# Patient Record
Sex: Male | Born: 1970 | Race: White | Hispanic: Yes | Marital: Married | Smoking: Former smoker
Health system: Southern US, Community
[De-identification: ages and names within clinical notes are randomized; demographics above are authoritative.]

## PROBLEM LIST (undated history)

## (undated) DIAGNOSIS — Z8619 Personal history of other infectious and parasitic diseases: Secondary | ICD-10-CM

## (undated) DIAGNOSIS — N19 Unspecified kidney failure: Secondary | ICD-10-CM

## (undated) DIAGNOSIS — I209 Angina pectoris, unspecified: Secondary | ICD-10-CM

## (undated) DIAGNOSIS — I499 Cardiac arrhythmia, unspecified: Secondary | ICD-10-CM

## (undated) DIAGNOSIS — Z992 Dependence on renal dialysis: Secondary | ICD-10-CM

## (undated) DIAGNOSIS — K219 Gastro-esophageal reflux disease without esophagitis: Secondary | ICD-10-CM

## (undated) DIAGNOSIS — N186 End stage renal disease: Secondary | ICD-10-CM

## (undated) DIAGNOSIS — T4145XA Adverse effect of unspecified anesthetic, initial encounter: Secondary | ICD-10-CM

## (undated) DIAGNOSIS — I10 Essential (primary) hypertension: Secondary | ICD-10-CM

## (undated) DIAGNOSIS — Z9889 Other specified postprocedural states: Secondary | ICD-10-CM

## (undated) DIAGNOSIS — R112 Nausea with vomiting, unspecified: Secondary | ICD-10-CM

## (undated) DIAGNOSIS — T8859XA Other complications of anesthesia, initial encounter: Secondary | ICD-10-CM

## (undated) DIAGNOSIS — Z8639 Personal history of other endocrine, nutritional and metabolic disease: Secondary | ICD-10-CM

## (undated) DIAGNOSIS — N189 Chronic kidney disease, unspecified: Secondary | ICD-10-CM

## (undated) DIAGNOSIS — D649 Anemia, unspecified: Secondary | ICD-10-CM

## (undated) HISTORY — DX: Essential (primary) hypertension: I10

## (undated) HISTORY — DX: Dependence on renal dialysis: Z99.2

## (undated) HISTORY — DX: Chronic kidney disease, unspecified: N18.9

## (undated) HISTORY — DX: Anemia, unspecified: D64.9

## (undated) HISTORY — DX: Personal history of other endocrine, nutritional and metabolic disease: Z86.39

## (undated) HISTORY — DX: Personal history of other infectious and parasitic diseases: Z86.19

## (undated) HISTORY — DX: Unspecified kidney failure: N19

## (undated) HISTORY — DX: End stage renal disease: N18.6

---

## 2005-06-25 ENCOUNTER — Emergency Department (HOSPITAL_COMMUNITY): Admission: EM | Admit: 2005-06-25 | Discharge: 2005-06-25 | Payer: Self-pay | Admitting: Emergency Medicine

## 2005-07-12 ENCOUNTER — Inpatient Hospital Stay (HOSPITAL_COMMUNITY): Admission: EM | Admit: 2005-07-12 | Discharge: 2005-07-23 | Payer: Self-pay | Admitting: Emergency Medicine

## 2005-07-12 ENCOUNTER — Ambulatory Visit: Payer: Self-pay | Admitting: Internal Medicine

## 2005-07-13 ENCOUNTER — Encounter (INDEPENDENT_AMBULATORY_CARE_PROVIDER_SITE_OTHER): Payer: Self-pay | Admitting: *Deleted

## 2005-07-14 ENCOUNTER — Encounter: Payer: Self-pay | Admitting: Vascular Surgery

## 2005-08-12 ENCOUNTER — Ambulatory Visit (HOSPITAL_COMMUNITY): Admission: RE | Admit: 2005-08-12 | Discharge: 2005-08-12 | Payer: Self-pay | Admitting: Vascular Surgery

## 2005-12-11 ENCOUNTER — Ambulatory Visit (HOSPITAL_COMMUNITY): Admission: RE | Admit: 2005-12-11 | Discharge: 2005-12-11 | Payer: Self-pay | Admitting: Nephrology

## 2005-12-18 ENCOUNTER — Encounter: Payer: Self-pay | Admitting: Vascular Surgery

## 2005-12-18 ENCOUNTER — Ambulatory Visit (HOSPITAL_COMMUNITY): Admission: RE | Admit: 2005-12-18 | Discharge: 2005-12-18 | Payer: Self-pay | Admitting: Vascular Surgery

## 2006-01-22 ENCOUNTER — Ambulatory Visit (HOSPITAL_COMMUNITY): Admission: RE | Admit: 2006-01-22 | Discharge: 2006-01-22 | Payer: Self-pay | Admitting: Nephrology

## 2006-09-11 ENCOUNTER — Ambulatory Visit: Payer: Self-pay | Admitting: Vascular Surgery

## 2006-09-12 ENCOUNTER — Ambulatory Visit: Payer: Self-pay | Admitting: Vascular Surgery

## 2006-09-12 ENCOUNTER — Ambulatory Visit (HOSPITAL_COMMUNITY): Admission: RE | Admit: 2006-09-12 | Discharge: 2006-09-12 | Payer: Self-pay | Admitting: Vascular Surgery

## 2006-11-18 IMAGING — CR DG CHEST 1V PORT
1 series · 1 of 1 positions shown · non-contrast
Comparison: none

CLINICAL DATA: Renal failure, catheter placement

Portable chest at 7628:
Comparison to earlier film of the same day. Tunneled right IJ catheter extends
to the proximal right atrium. There is mild kink in one of the lumens near the
vein entry site. No pneumothorax. Lungs clear. Heart size normal.

[view not recorded]
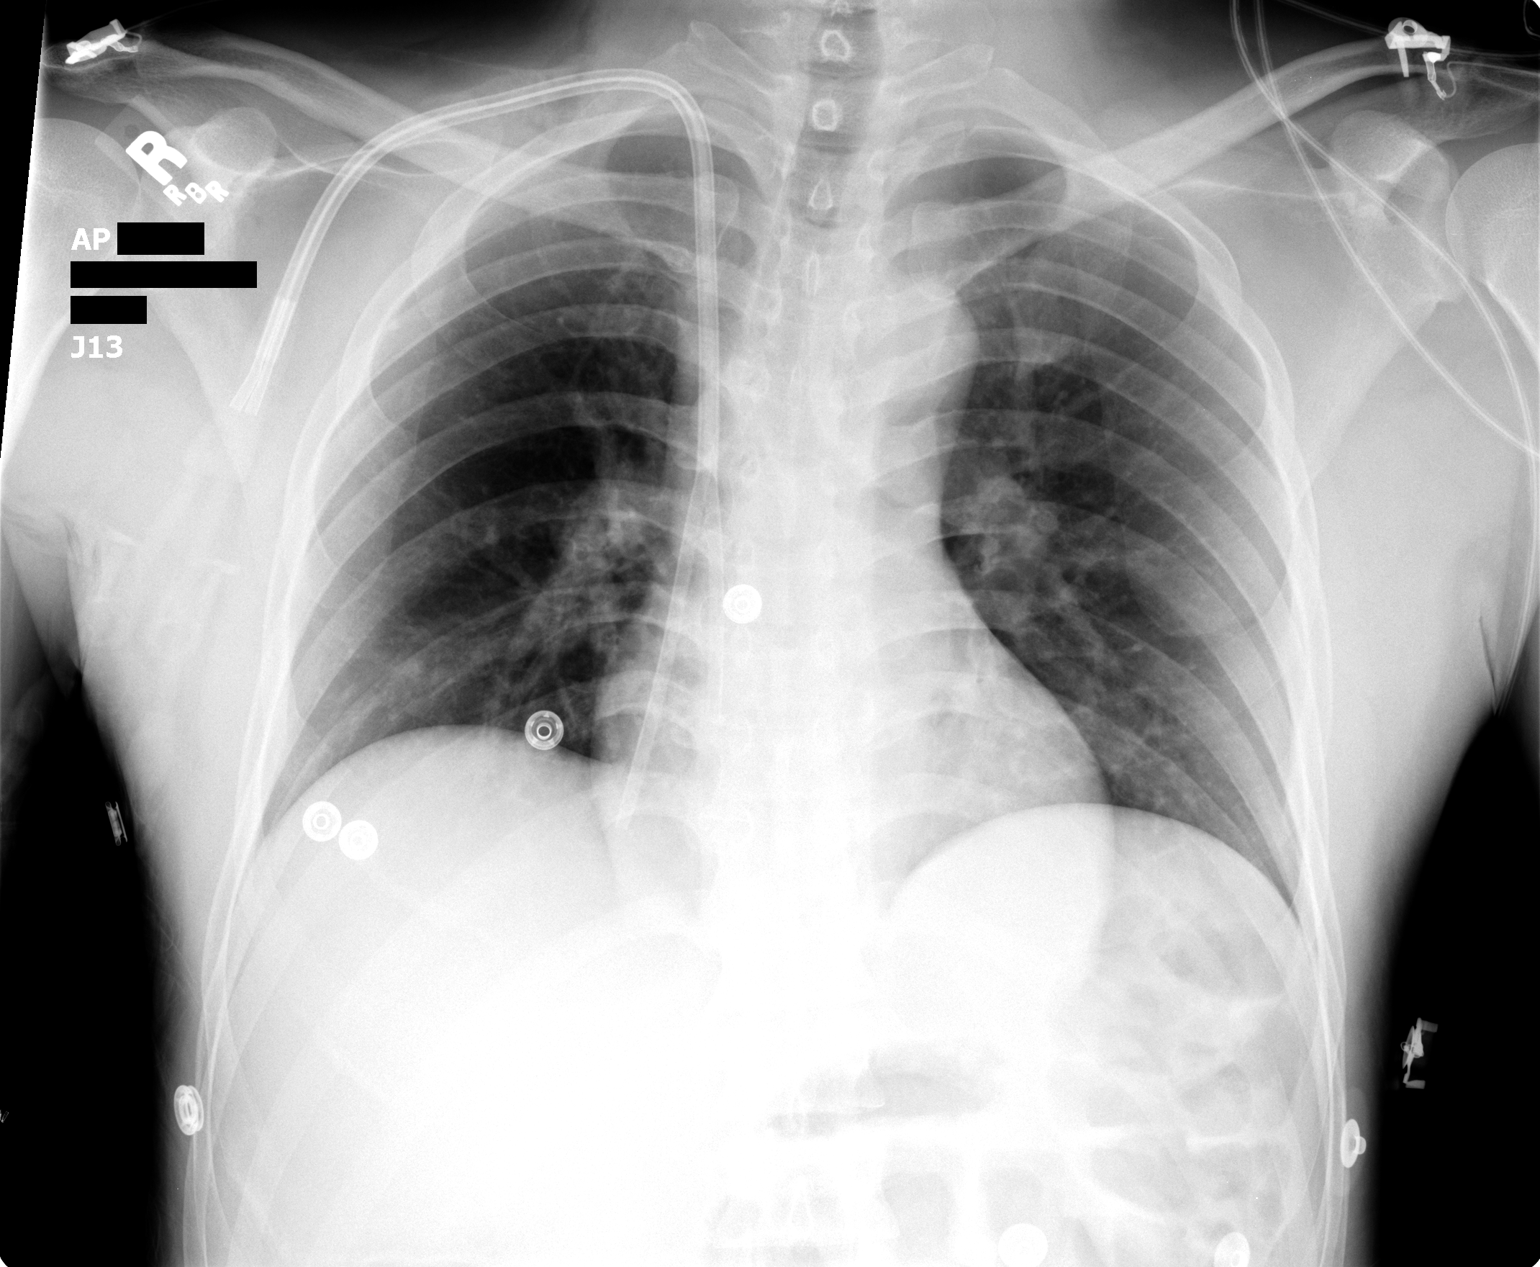

[1 of 1 positions shown; findings below may reference images not displayed]

IMPRESSION: 1. Central line to proximal right atrium; no pneumothorax.

## 2006-11-18 IMAGING — US US RENAL
1 series · 14 of 25 positions shown · non-contrast
Comparison: None.

CLINICAL DATA: End-stage renal failure.  Question hydronephrosis.
 RENAL/URINARY TRACT ULTRASOUND:
TECHNIQUE: Complete ultrasound examination of the urinary tract was performed including evaluation of the kidneys, renal collecting systems, and urinary bladder.

[Series 1: renal · 0.29mm/px · 14 of 26 slices shown]
[im 1/26]
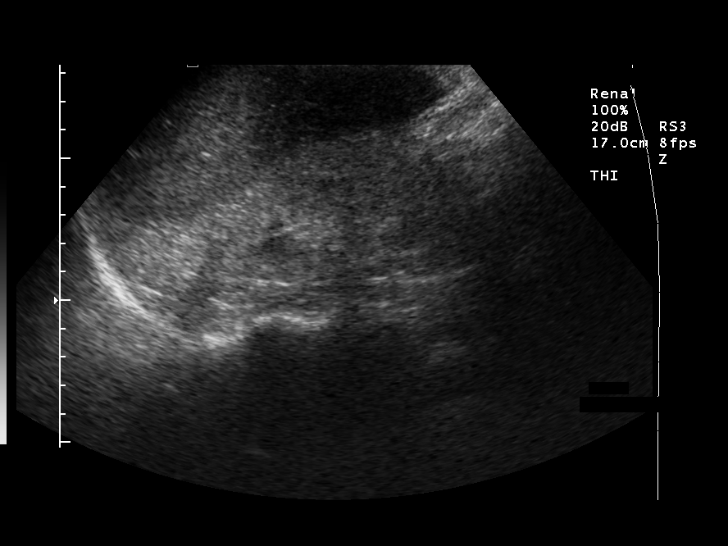
[im 3/26]
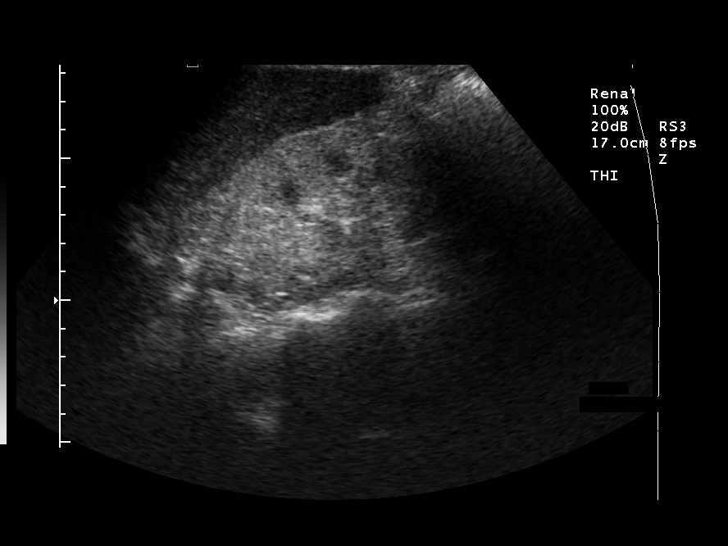
[im 5/26]
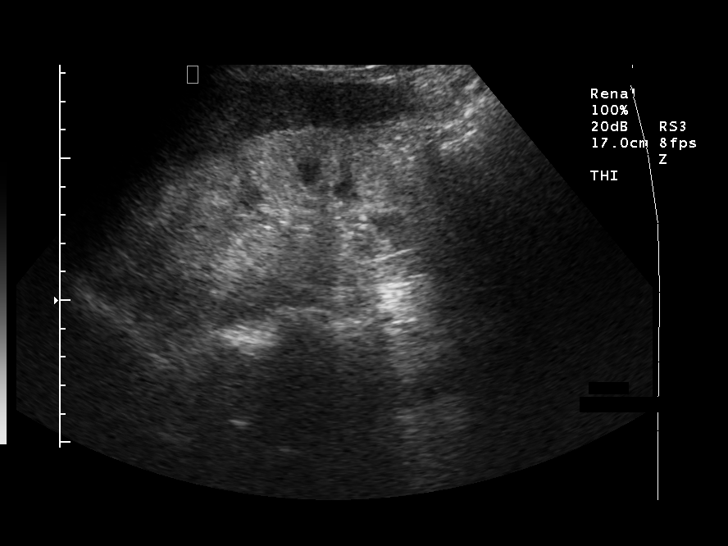
[im 7/26]
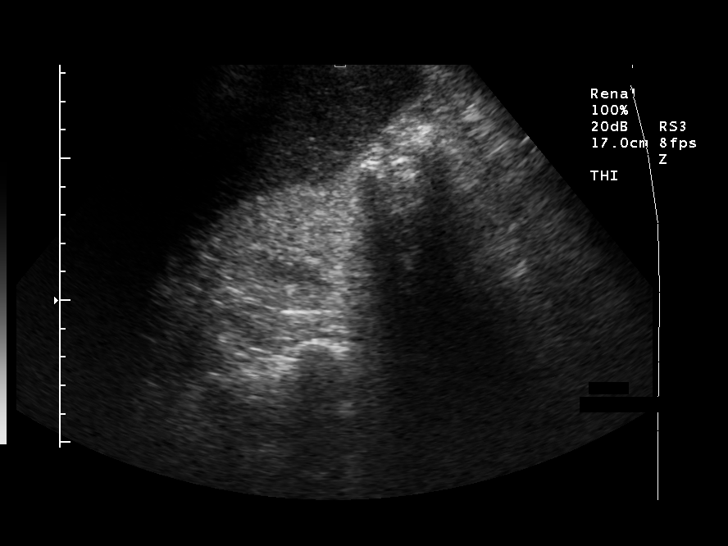
[im 9/26]
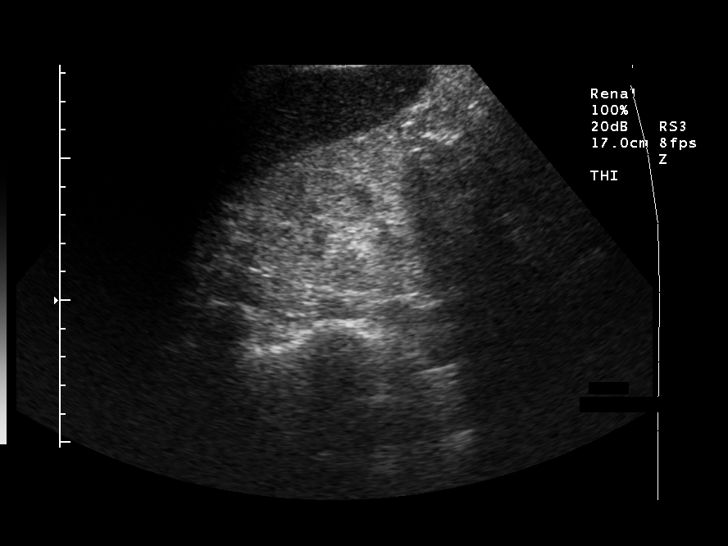
[im 10/26]
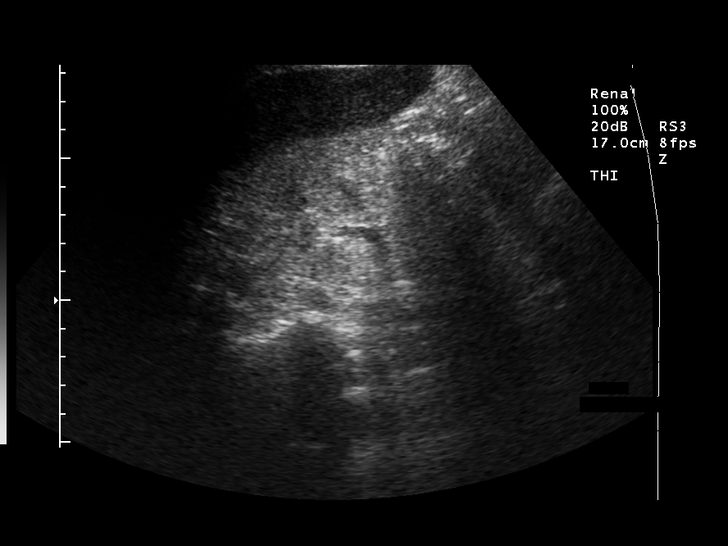
[im 12/26]
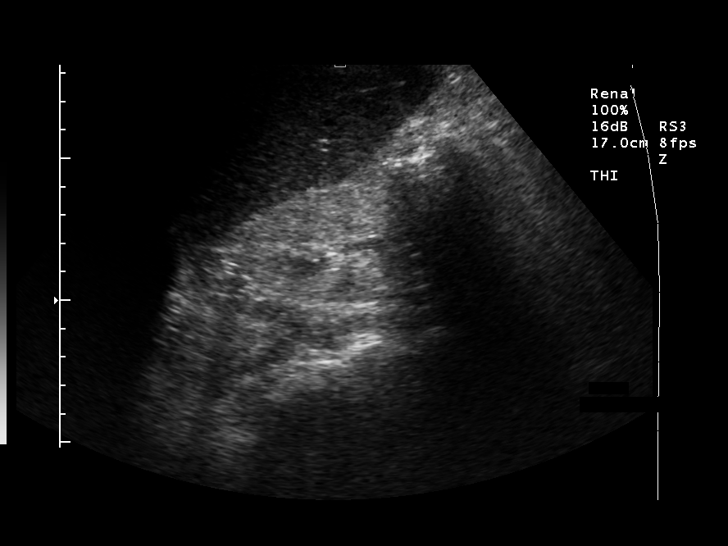
[im 14/26]
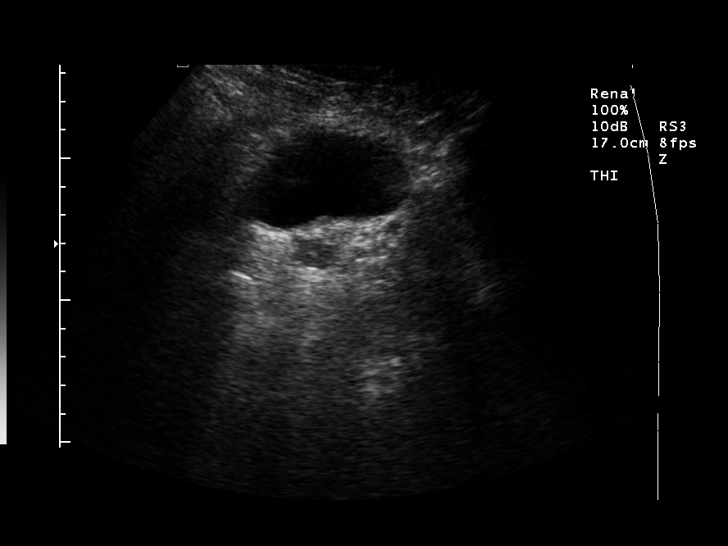
[im 16/26]
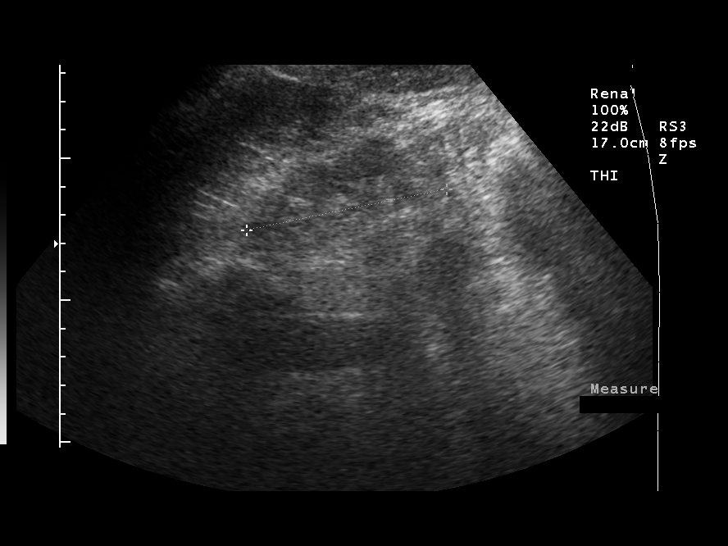
[im 17/26]
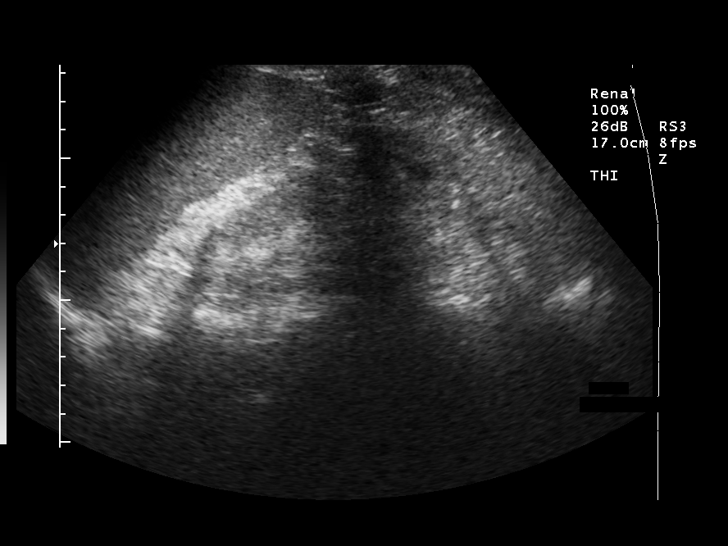
[im 19/26]
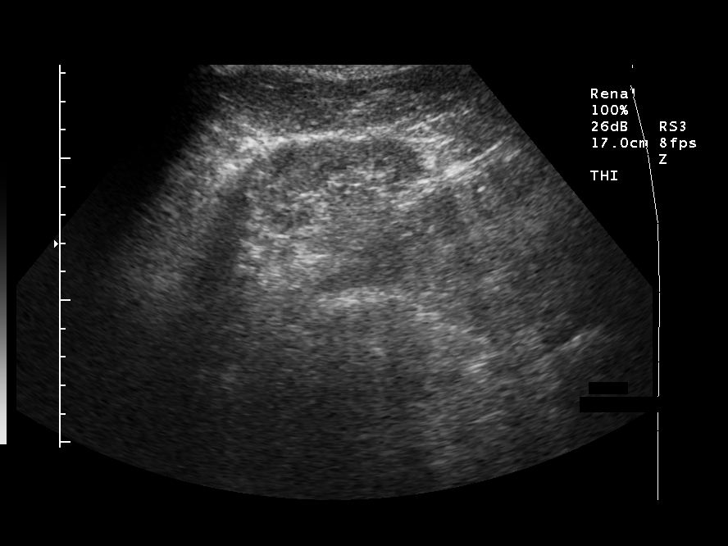
[im 21/26]
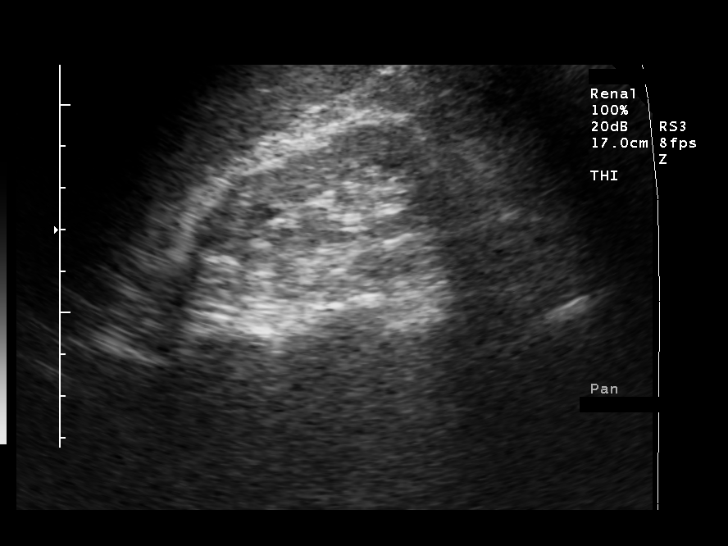
[im 23/26]
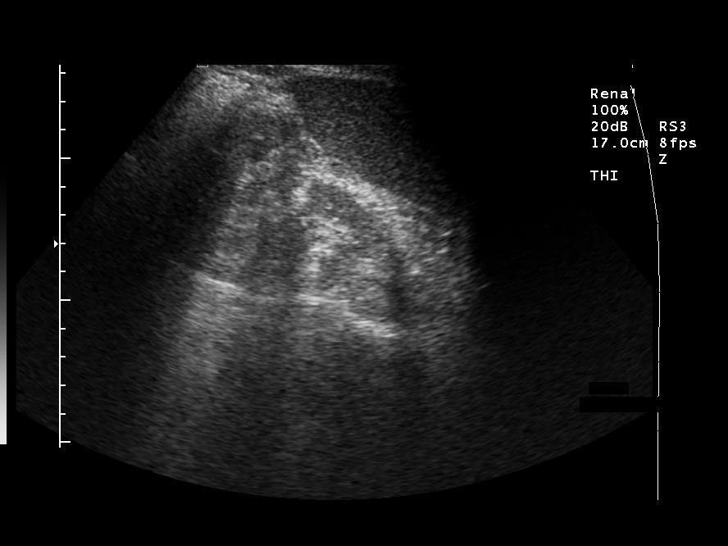
[im 26/26]
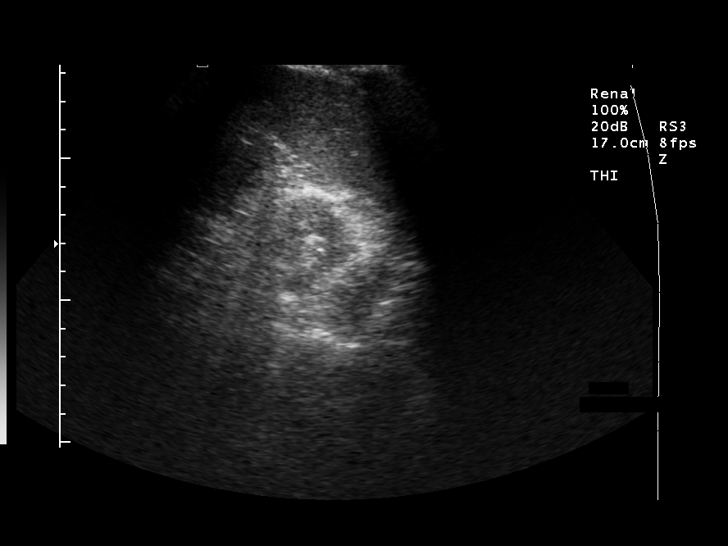

[14 of 25 positions shown; findings below may reference images not displayed]

FINDINGS: Both kidneys are markedly echogenic and difficult to visualize.  The right kidney measures approximately 12.1 cm in length and the left kidney, 7.2 cm in length.  There is no hydronephrosis, and no focal renal abnormalities are apparent.  Urinary bladder appears unremarkable for its degree of distention.
IMPRESSION: 1.  No evidence of hydronephrosis.
 2.  Markedly echogenic kidneys with significant atrophy on the left, compatible with ?chronic medical renal disease?.  Correlate clinically.

## 2006-11-18 IMAGING — CR DG CHEST 2V
2 series · 2 of 2 positions shown · non-contrast
Comparison: none

CLINICAL DATA: Renal failure

Chest 2 view:
Comparison 07/12/2005. The heart size and mediastinal contours are within normal
limits.  Both lungs are clear.  The visualized skeletal structures are
unremarkable.

[w chest pa]
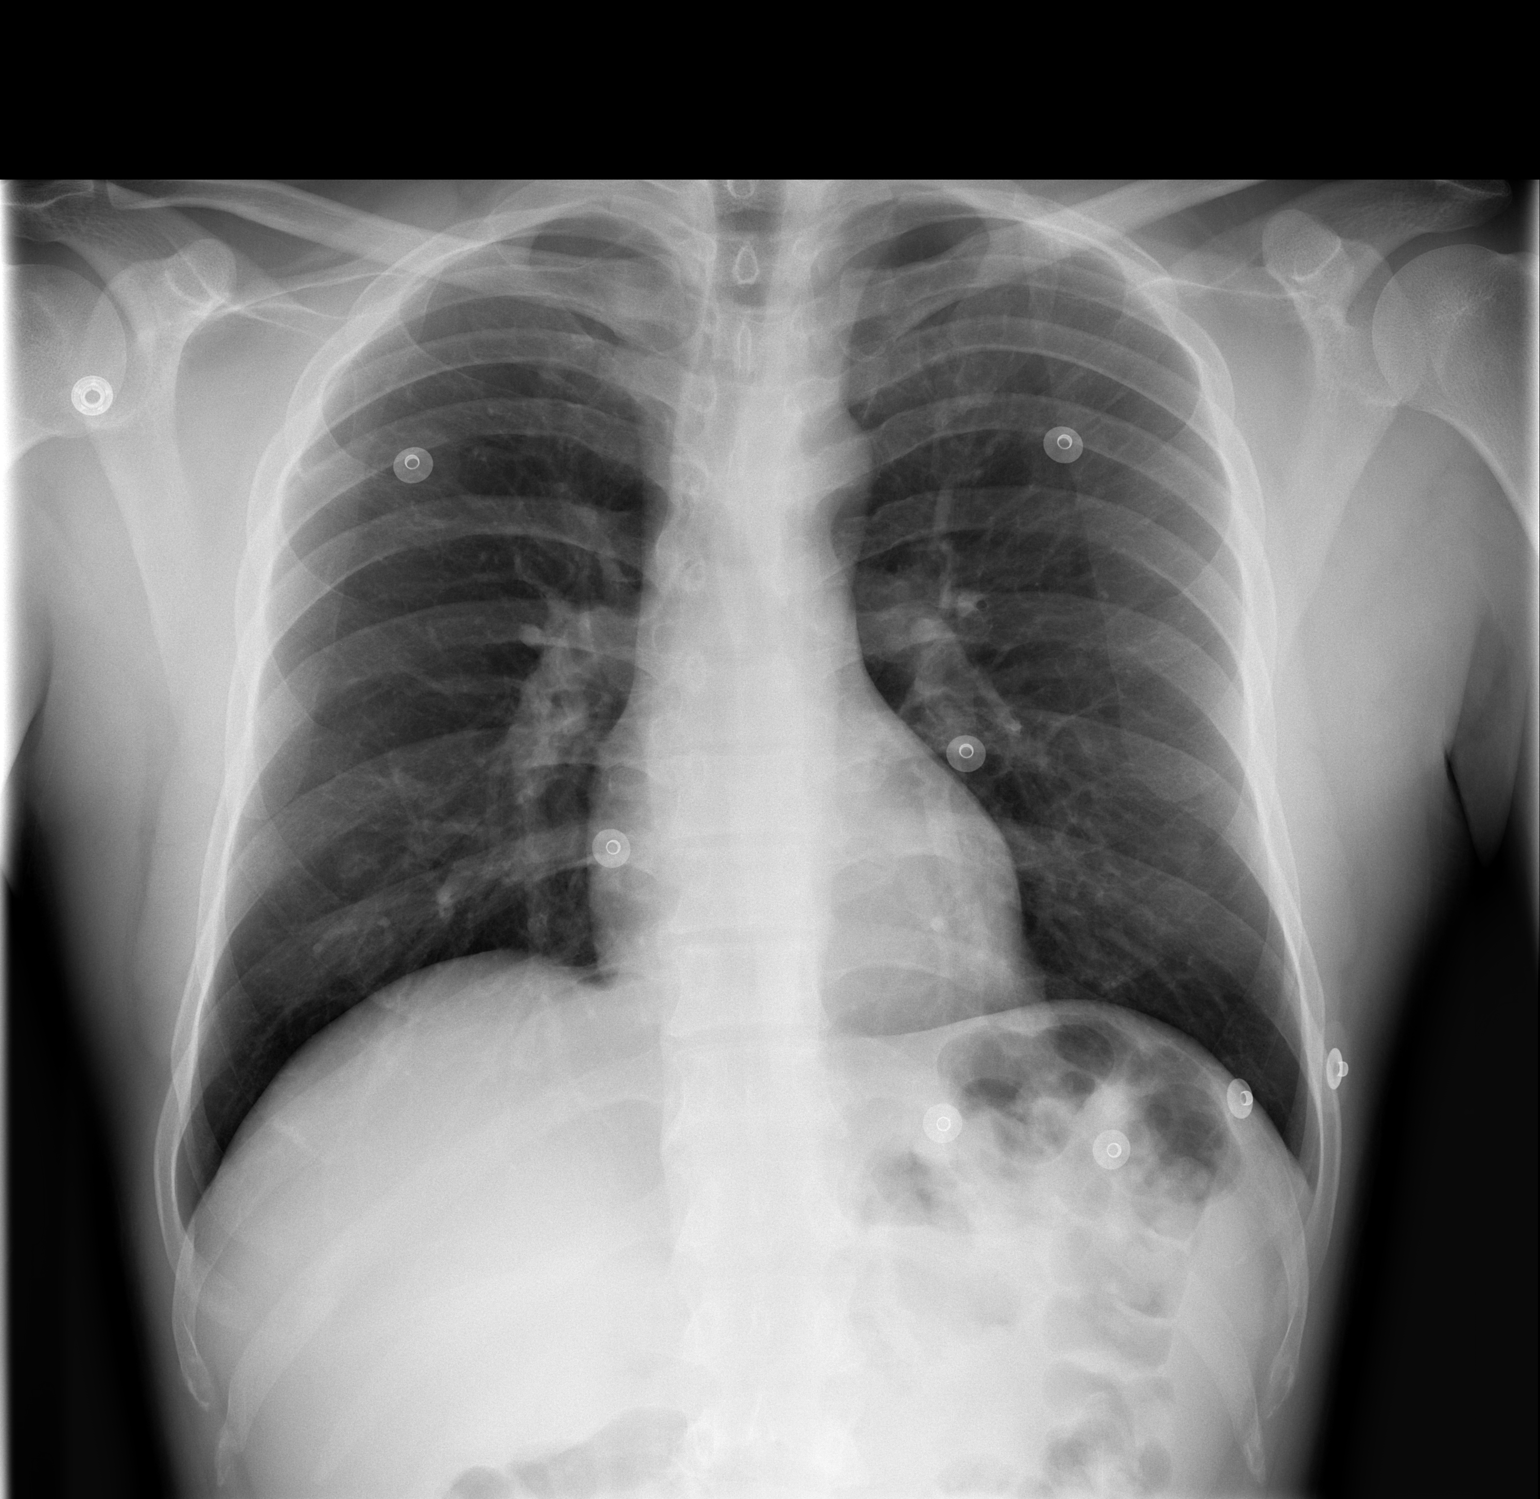

[w chest lat]
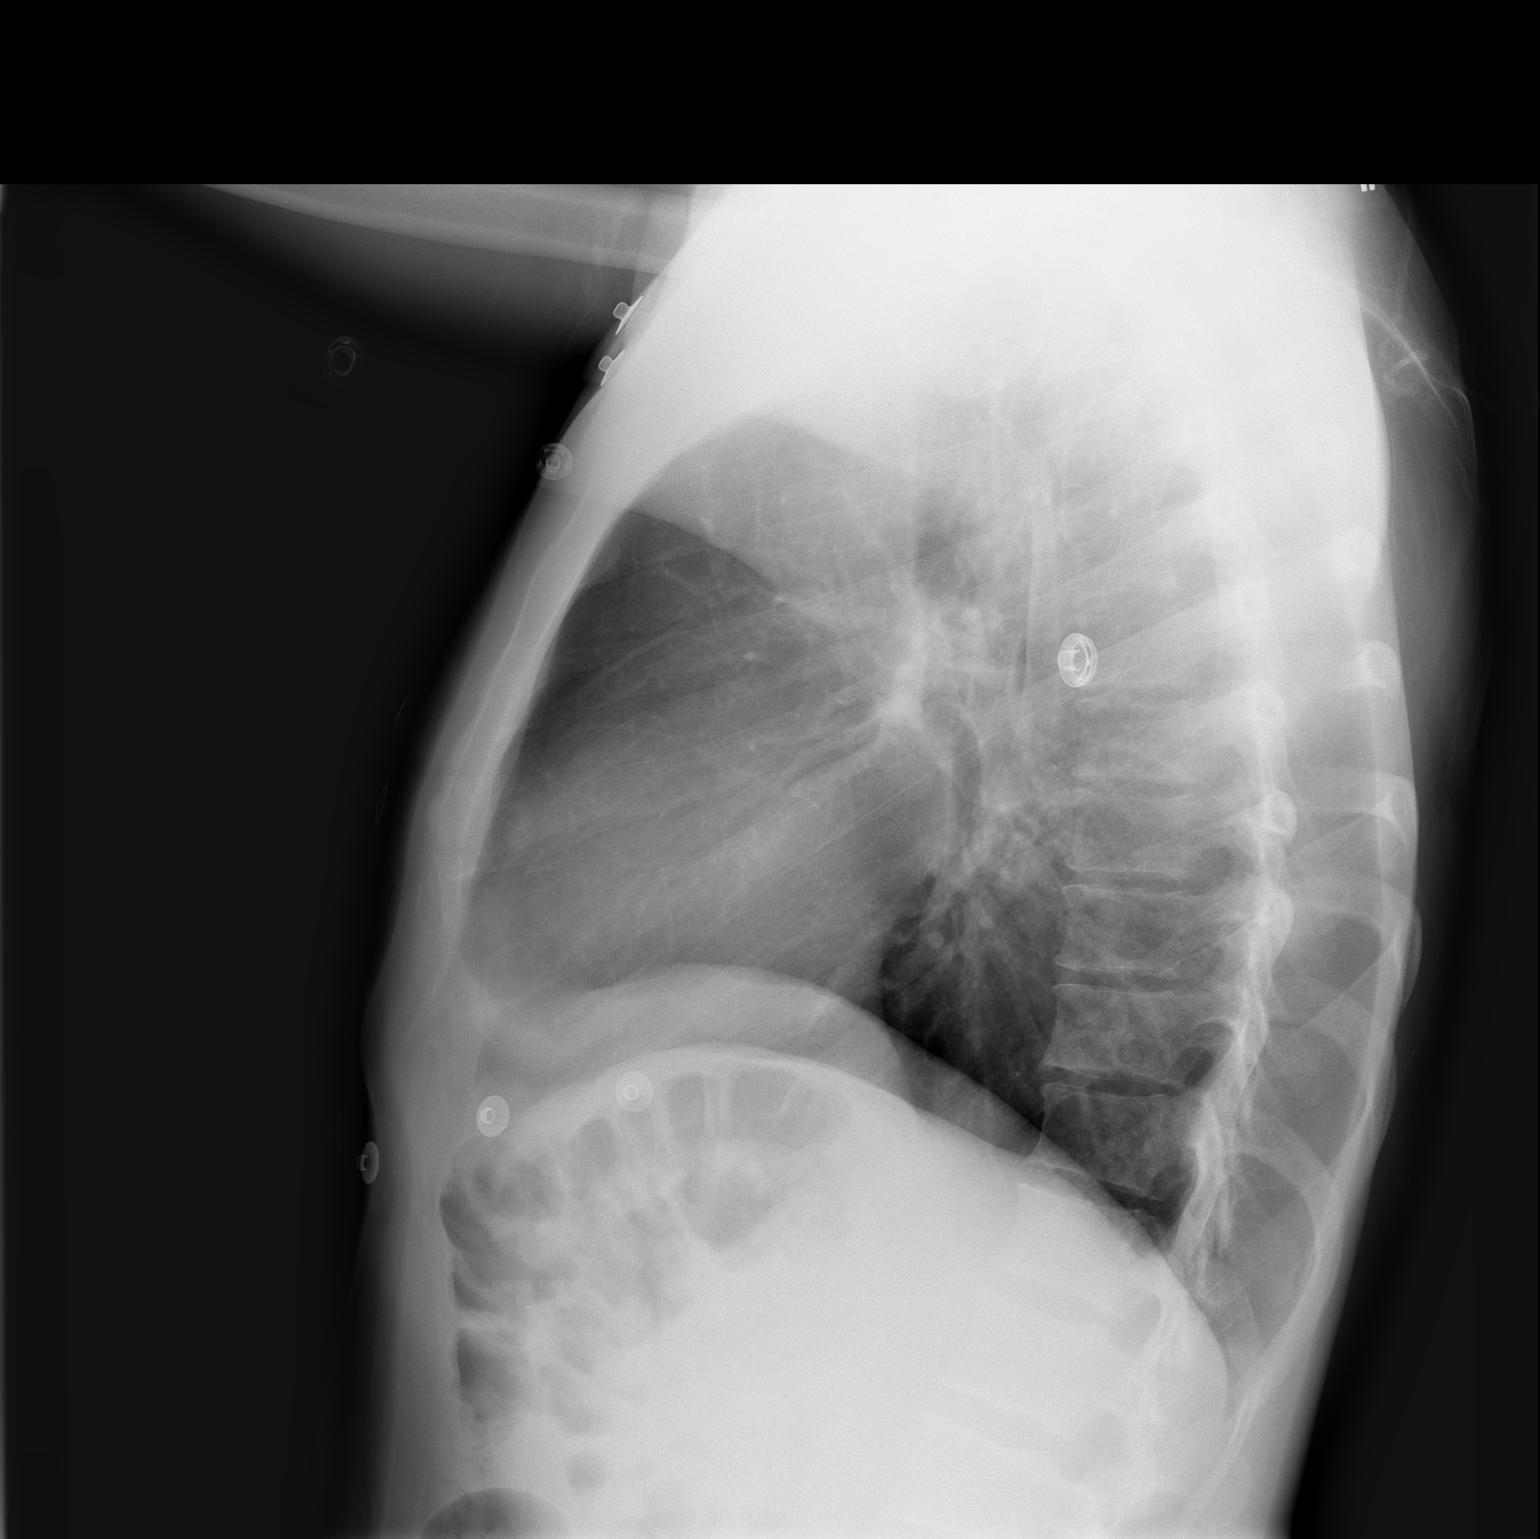

[2 of 2 positions shown; findings below may reference images not displayed]

IMPRESSION: 1. No active cardiopulmonary disease.

## 2006-11-19 ENCOUNTER — Ambulatory Visit (HOSPITAL_COMMUNITY): Admission: RE | Admit: 2006-11-19 | Discharge: 2006-11-19 | Payer: Self-pay | Admitting: Vascular Surgery

## 2006-11-19 ENCOUNTER — Ambulatory Visit: Payer: Self-pay | Admitting: Surgery

## 2008-03-24 ENCOUNTER — Emergency Department (HOSPITAL_COMMUNITY): Admission: EM | Admit: 2008-03-24 | Discharge: 2008-03-24 | Payer: Self-pay | Admitting: Emergency Medicine

## 2008-07-27 ENCOUNTER — Ambulatory Visit: Payer: Self-pay | Admitting: Gastroenterology

## 2008-08-24 ENCOUNTER — Ambulatory Visit: Payer: Self-pay | Admitting: Gastroenterology

## 2008-09-28 ENCOUNTER — Encounter (INDEPENDENT_AMBULATORY_CARE_PROVIDER_SITE_OTHER): Payer: Self-pay | Admitting: Interventional Radiology

## 2008-09-28 ENCOUNTER — Ambulatory Visit (HOSPITAL_COMMUNITY): Admission: RE | Admit: 2008-09-28 | Discharge: 2008-09-28 | Payer: Self-pay | Admitting: Gastroenterology

## 2010-06-24 ENCOUNTER — Other Ambulatory Visit (HOSPITAL_COMMUNITY): Payer: Self-pay | Admitting: Nephrology

## 2010-06-24 DIAGNOSIS — N186 End stage renal disease: Secondary | ICD-10-CM

## 2010-07-11 ENCOUNTER — Ambulatory Visit (HOSPITAL_COMMUNITY)
Admission: RE | Admit: 2010-07-11 | Discharge: 2010-07-11 | Disposition: A | Discharge status: Home / Self Care | Payer: Self-pay | Source: Ambulatory Visit | Attending: Nephrology | Admitting: Nephrology

## 2010-07-11 DIAGNOSIS — N2581 Secondary hyperparathyroidism of renal origin: Secondary | ICD-10-CM | POA: Insufficient documentation

## 2010-07-11 DIAGNOSIS — D649 Anemia, unspecified: Secondary | ICD-10-CM | POA: Insufficient documentation

## 2010-07-11 DIAGNOSIS — B1920 Unspecified viral hepatitis C without hepatic coma: Secondary | ICD-10-CM | POA: Insufficient documentation

## 2010-07-11 DIAGNOSIS — Z0389 Encounter for observation for other suspected diseases and conditions ruled out: Secondary | ICD-10-CM | POA: Insufficient documentation

## 2010-07-11 DIAGNOSIS — I120 Hypertensive chronic kidney disease with stage 5 chronic kidney disease or end stage renal disease: Secondary | ICD-10-CM | POA: Insufficient documentation

## 2010-07-11 DIAGNOSIS — Z992 Dependence on renal dialysis: Secondary | ICD-10-CM | POA: Insufficient documentation

## 2010-07-11 DIAGNOSIS — N186 End stage renal disease: Secondary | ICD-10-CM

## 2010-07-11 MED ORDER — IOHEXOL 300 MG/ML  SOLN
100.0000 mL | Freq: Once | INTRAMUSCULAR | Status: AC | PRN
  Administered 2010-07-11: 38 mL via INTRAVENOUS

## 2010-07-15 MED ORDER — IOHEXOL 300 MG/ML  SOLN
100.0000 mL | Freq: Once | INTRAMUSCULAR | Status: AC | PRN
  Administered 2010-07-15: 38 mL via INTRAVENOUS

## 2010-07-22 LAB — PROTIME-INR
INR: 0.9 (ref 0.00–1.49)
Prothrombin Time: 12.7 seconds (ref 11.6–15.2)

## 2010-07-22 LAB — CBC
Hemoglobin: 16.4 g/dL (ref 13.0–17.0)
MCHC: 34.2 g/dL (ref 30.0–36.0)
RBC: 4.98 MIL/uL (ref 4.22–5.81)
WBC: 5.2 10*3/uL (ref 4.0–10.5)

## 2010-07-22 LAB — APTT: aPTT: 32 seconds (ref 24–37)

## 2010-08-06 ENCOUNTER — Ambulatory Visit (INDEPENDENT_AMBULATORY_CARE_PROVIDER_SITE_OTHER): Payer: Self-pay | Admitting: Vascular Surgery

## 2010-08-06 DIAGNOSIS — N186 End stage renal disease: Secondary | ICD-10-CM

## 2010-08-07 NOTE — Assessment & Plan Note (Signed)
OFFICE VISIT  [name], Logan C DOB:  Nov 20, 1970                                       08/06/2010 [id number]  Patient presents today for evaluation of upper arm AV fistula.  I had an interpreter by telephone since he does not speak [place].  He does have a long-time, well-functioning left arm AV fistula, and this has not been functional recently with poor flow.  He had a fistulogram a month ago, and I have reviewed the actual films and discussed these with patient as well.  This showed a large-caliber cephalic vein in the antecubital space more proximally.  The interpretation by the radiologist was nonrefluxing into the brachial artery anastomosis.  On physical exam, it is apparent that he does not have a brachial artery anastomosis, and this is actually a Cimino fistula with good maturation all the way up onto his upper arm.  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  I discussed this with Dr. Jimmy Footman as well.  He feels that he is not having a good clearance, and therefore we will proceed with a conversion of this to a true upper arm AV fistula.  We have scheduled this at his convenience in the next 1-2 weeks.    Rosetta Posner, M.D. Electronically Signed  TFE/MEDQ  D:  08/06/2010  T:  08/07/2010  Job:  [id number]

## 2010-08-27 NOTE — Op Note (Signed)
NAMEJUDSON, MAZOR NO.:  [id number]   MEDICAL RECORD NO.:  [id number]          PATIENT TYPE:  AMB   LOCATION:  SDS                          FACILITY:  [facility]   PHYSICIAN:  Napoleon Form IV, MDDATE OF BIRTH:  27-Dec-1970   DATE OF PROCEDURE:  11/19/2006  DATE OF DISCHARGE:                               OPERATIVE REPORT   PREOPERATIVE DIAGNOSIS:  Endstage renal disease.   POSTOPERATIVE DIAGNOSES:  Endstage renal disease.   PROCEDURE PERFORMED:  Removal of PermCath.   ANESTHESIA:  Local.   COMPLICATIONS:  None.   INDICATIONS FOR PROCEDURE:  Mr. Logan [name] is a 40 year old Hispanic  gentleman with end-stage renal disease who had a right neck dialysis  catheter placed.  He is now dialyzing through a functional left arm  graft.  Request has been made to remove the catheter.  Informed consent  was signed in the presence of a Spanish interpreter.   The right upper chest was prepped and draped with Betadine.  Lidocaine,  1%, was used for local anesthesia.  Using blunt dissection, the cuff was  exposed and the fibrinous tissue was debrided back to normal catheter.  The catheter was then removed.  Manual pressure was held for 5 minutes.  There were no immediate complications.  Sterile dressing was applied.  The patient tolerated the procedure well.      Serita Butcher, MD  Electronically Signed     VWB/MEDQ  D:  11/19/2006  T:  11/20/2006  Job:  [id number]

## 2010-08-27 NOTE — Assessment & Plan Note (Signed)
OFFICE VISIT   COLCHADO-MALDONADO, Logan [name]  DOB:  1970/12/01                                       09/11/2006  [id number]   The patient is a long term hemodialysis patient. He was sent over today  from the kidney center to evaluate drainage and warmth in his left  forearm AV graft. He is also having fevers.  [name]   HISTORY OF PRESENT ILLNESS:  Temperature 98.8, blood pressure 133/86,  heart rate 89 and regular. Left forearm shows erythema over the ulnar  aspect of the graft. There is a 3 cm nodule with clearish purulent  drainage over the mid portion of this graft, and this is tender to  palpation. The radial end of the graft does not appear as erythematous,  but does have some edema. He has a palpable radial pulse. There is an  audible bruit, and palpable thrill in the graft. He did not receive  dialysis today because of concerns about infection. Unfortunately, he  does not speak [place]. I was able to communicate with him that the  graft would need to be removed. We have scheduled this for tomorrow  morning at [facility] by Dr. Donnetta Hutching. He will also need to have a  Diatek catheter placed at that time. I wrote all of these instructions  down for him so he would understand more clearly.   Jessy Oto. Fields, MD  Electronically Signed   CEF/MEDQ  D:  09/12/2006  T:  09/14/2006  Job:  [id number]

## 2010-08-27 NOTE — Op Note (Signed)
NAMEESTEFANO, CROMBIE NO.:  [id number]   MEDICAL RECORD NO.:  [id number]          PATIENT TYPE:  AMB   LOCATION:  SDS                          FACILITY:  [facility]   PHYSICIAN:  Rosetta Posner, M.D.    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   PROCEDURE:  1. Resection of infected arterial limb of arteriovenous Gore-Tex graft      and ligation of graft.  2. Creation of left Cimino arteriovenous fistula.  3. Placement of right internal jugular Diatek catheter with ultrasound      visualization.   ASSISTANT:  Nurse.   ANESTHESIA:  LMA.   COMPLICATIONS:  None.   DISPOSITION:  To the recovery room, stable with x-ray pending.   PROCEDURE IN DETAIL:  The patient was taken to the operating room and  placed in the supine position.  The area of the left arm prepped and  draped in usual sterile fashion.  An incision was made at the apex of  the forearm loop and also over the graft near the arterial anastomosis  of the antecubital space.  The graft was isolated at both of these  incisions.  It was well incorporated with no evidence of infection.  The  graft was mobilized proximally and distally through these small  incisions and the graft was ligated proximal and distal.  The graft was  divided and the wounds were closed.   The patient had a very large cephalic vein throughout its forearm.  This  did not appear to be disrupted by the prior loop.  An incision was made  at the level of the wrist between the cephalic vein and the radial  artery.  The vein was indeed very good caliber.  It was ligated  distally, divided and mobilized to the level of the radial artery.   The  artery was occluded proximally and distally, was opened with an 11-blade  and extended with Potts scissors.  The vein was cut to appropriate  length, spatulated and sewn end-to-side to the artery with a running 6-0  Prolene suture.  The clamps were removed and excellent thrill was noted  to the fistula.   The wound was irrigated with saline.  Hemostasis obtained with  electrocautery.  The wound was closed with 3-0 Vicryl in the  subcutaneous subcuticular tissue.  Next the infected segment over the  puncture sites over the arterial limb of the graft was excised with an  ellipse of incision and the Gore-Tex graft in the bed of this wound was  removed in its entirety.  The wound was irrigated with saline and was  packed with 4 x 4.  The sterile dressing was applied.   Next the right and left neck were imaged with ultrasound revealing  widely patent jugular vein on the right, small jugular vein on the left.  The patient was placed in  the Trendelenburg position using a finder  needle.  The internal jugular vein was identified.  Next using a  Seldinger technique guide wire was passed down to the level of the right  atrium.  Dilator and peel-away sheath was passed over the guidewire.  The dilator and guide wire were removed.  The catheter was 28 cm.  The  catheter was passed through the peel-away sheath to the level of the  distal right atrium.  The catheter was brought through different  subcutaneous tunnel through a separate stab incision.  The 2 lumen ports  were attached and both lumens flushed and aspirated easily and were  locked with 1000 units/cc heparin.  The catheter was secured to the skin  through a nylon stitch and the end-of-sites closed 4-0 subcuticular  Vicryl stitch.  A sterile dressing was applied.  The patient was taken  to the recovery room in stable condition.      Rosetta Posner, M.D.  Electronically Signed     TFE/MEDQ  D:  09/12/2006  T:  09/12/2006  Job:   [id number]

## 2010-08-30 NOTE — Op Note (Signed)
NAME:  NEGAN, GARNETT NO.:  [id number]   MEDICAL RECORD NO.:  [id number]          PATIENT TYPE:  AMB   LOCATION:  SDS                          FACILITY:  [facility] [facility]   PHYSICIAN:  Charles E. Fields, MD  DATE OF BIRTH:  08-15-1970   DATE OF PROCEDURE:  12/18/2005  DATE OF DISCHARGE:                                 OPERATIVE REPORT   PROCEDURE:  1. Thrombectomy and revision of left forearm AV graft.  2. Intraoperative shuntogram.   PREOPERATIVE DIAGNOSIS:  Thrombosed left forearm AV graft.   POSTOPERATIVE DIAGNOSIS:  Thrombosed left forearm AV graft.   ANESTHESIA:  Local with IV sedation.   ASSISTANT:  Jacinta Shoe, P.A.   OPERATIVE FINDINGS:  1. No significant venous narrowing.  2. Very small brachial artery inflow.   OPERATIVE DETAILS:  After obtaining informed consent, the patient taken to  the operating.  The patient placed in the supine position on the operating  table.  After adequate sedation, the patient's entire left upper extremity  is prepped and draped in the usual sterile fashion.  Local anesthesia was  infiltrated in a preexisting longitudinal scar just above the antecubital  crease.  Incision was carried down through subcutaneous tissues down to the  level of venous limb of graft.  The venous limb of the graft and anastomosis  was dissected free circumferentially.  The basilic vein was of good caliber,  approximately 4 mm in diameter.   Next, the patient was given 5000 units of intravenous heparin.  A transverse  graftotomy was made just above the level of the venous anastomosis.  A #4  Fogarty catheter was used to thrombectomized the venous limb of graft until  all thrombotic material was removed; and there was good venous backbleeding.  The anastomosis was then probed and found to easily accept the 3, 4, and 5  coronary dilators.  This was then controlled with a fine bulldog clamp.   Next, the catheter was passed through the arterial  limb of the graft;  multiple passes were obtained; and there was good arterial flow, but an  arterialized plug was not retrieved.  It was a 4-to-7-mm tapered graft; so,  it was decided to close the graftotomy and shoot a shuntogram to make sure  that all arterial clot had been removed.  Then 6-0 Prolene was used to close  the graftotomy with proximal control with the shunt clamp.  At completion  anastomosis, this was fore bled, back bled, and thoroughly flushed.  Anastomosis was secured, a 21-gauge butterfly needle was then placed into  the graft just above the venous anastomosis and with outflow occlusion, a  shuntogram was obtained.  This showed essentially zero flow into the native  artery.  The butterfly needle was removed, then the hole repaired with a  single 6-0 Prolene suture.  Dissection was then carried down to the level of  the arterial anastomosis.  The arterial artery was dissected free proximal  and distal to the anastomosis.  The artery was approximately 2 mm in  diameter.   Next, a graftotomy was made just above the arterial anastomosis.  A #4  Fogarty catheter was used  to thrombectomize this portion of the graft; and  there was good arterial inflow; however, there was a thickened portion of  the graft just above this.  The graftotomy was closed at this level; and  there was good arterial flow to this point.  However, in the thickened  segment, there was still minimal flow.  A transverse graftotomy was made in  the midportion of the thickened segment; and on opening this there was a  large amount of pseudointima.  This was all removed under direct vision.  The arterial inflow was then much improved.  The graftotomy was then  repaired using a running 6-0 Prolene suture.  Just prior to completion  anastomosis; this was fore bled, back bleed, and thoroughly flushed.  Anastomosis was secured, clamps were released.  There was palpable thrill on  the venous limb of the graft  immediately.   Next hemostasis was obtained; and the patient was also given 50 mg of  protamine.  The patient had a palpable radial pulse.  The subcutaneous  tissues were reapproximated using running 3-0 Vicryl suture.  Skin was  closed with 4-0 Vicryl subcuticular stitch.  The patient tolerated the  procedure well; and there were no complications.  Instrument, sponge, and  needle counts were correct at the end of the case.  The patient was taken to  the recovery room in stable condition.      Jessy Oto. Fields, MD  Electronically Signed     CEF/MEDQ  D:  12/18/2005  T:  12/19/2005  Job:  [id number]

## 2010-08-30 NOTE — Consult Note (Signed)
NAME:  Logan [name] NO.:  [id number]   MEDICAL RECORD NO.:  [id number]          PATIENT TYPE:  INP   LOCATION:  5511                         FACILITY:  [place]   PHYSICIAN:  Sol Blazing, M.D.DATE OF BIRTH:  Sep 13, 1970   DATE OF CONSULTATION:  07/12/2005  DATE OF DISCHARGE:                                   CONSULTATION   REASON FOR CONSULTATION:  Elevated BUN and creatinine.   HISTORY:  The patient is a 40 year old Hispanic male with a 2-3 week history  of nausea and fatigue.  He has also vomited.  Outpatient laboratories were  drawn, and creatinine was found to be over 20.  He has no prior history of  renal disease.  His father has kidney disease and lives in [place], but is  not on dialysis.  He has not had any difficulty voiding, hematuria, history  of kidney stone or kidney problems in the past.  He does not speak [place].   MEDICATIONS:  He uses Tylenol for headaches.  He is on no other medications.   SOCIAL HISTORY:  Social alcohol use.  No cocaine or IV drugs or tobacco.  He  is married.  He works in [employer].  He has 2 children,  one 71 and one 59 years old.   FAMILY HISTORY:  Mother deceased at 65 years.  Three sisters and 1 brother,  healthy.  Father has kidney disease.   REVIEW OF SYSTEMS:  GENERAL:  Positive for subjective fever, weight loss,  nausea, vomiting, diarrhea and dizziness.  Negative for weight gain, recent  travel, sick contacts, earache, nose bleed, sore throat, chest pain,  palpitations, orthopnea, edema, dyspnea on exertion.  Negative for jaundice,  dysphagia, odynophagia, flank pain, nocturia, rash or itching, heat or cold  intolerance.  Negative for joint pains or swelling, muscle or back pain,  weakness, focal numbness, tremor, headache.   PHYSICAL EXAMINATION:  VITAL SIGNS:  Blood pressure 130/90, temperature 97,  pulse 70, respirations 18, O2 saturation 100% on room air.  GENERAL:  This is a  well-developed Hispanic male in no distress.  He is  alert and oriented x3.  Non-toxic.  SKIN:  Without rash.  HEENT:  Pupils equal, round and reactive to light.  Extraocular movements  intact.  Throat is clear.  NECK:  Supple without JVD.  CHEST:  Clear throughout.  CARDIAC:  Regular rate and rhythm without murmur, rub, or gallop.  ABDOMEN:  Soft, nontender, without masses.  EXTREMITIES:  No peripheral edema.  Pulses in the feet are 2+.  SKIN:  No rash.  NEUROLOGIC:  Alert and oriented x3.  No asterixis.  Non-focal motor exam.   LABORATORY DATA:  Sodium 138, potassium 4.3, CO2 of 8, BUN 139, creatinine  19.9.  PTT 31, INR 1.0.  Hemoglobin 8, white blood count 4000, platelets  176.  Liver enzymes drawn with albumin of 4.0.  Urinalysis with 0-2 red  blood cells, greater than 300 protein, no bacteria.  Granulocasts negative.   IMPRESSION:  1.  Severe azotemia, proteinuria; suspect intrinsic renal      disease/glomerulopathy such as focal segmental glomerulosclerosis for  example.  The patient has severe azotemia and may have end-stage renal      disease.  Will need to look at the ultrasound results which will be      helpful in this regard.  We did not have any old labs, as the patient      had never been sick before.  2.  Hypertension, which is not severe and probably does not explain this      degree of renal failure.  3.  Anemia, likely secondary to #1.  4.  Volume status.  He is euvolemic.   RECOMMENDATIONS:  Renal ultrasound is the most important next test.  Also  agree with checking complement levels & HIV.  Will add an ANA and hepatitis  B and C.  Will check PTH.  The patient will most likely require dialysis  within the next few days.      Sol Blazing, M.D.  Electronically Signed     RDS/MEDQ  D:  07/13/2005  T:  07/15/2005  Job:  [id number]

## 2010-08-30 NOTE — Discharge Summary (Signed)
NAME:  Logan [name]     ACCOUNT NO.:  [id number]   MEDICAL RECORD NO.:  [id number]          PATIENT TYPE:  INP   LOCATION:  5511                         FACILITY:  [facility]   PHYSICIAN:  Caren Griffins B. Lorrene Reid, M.D.DATE OF BIRTH:  01-04-1971   DATE OF ADMISSION:  07/12/2005  DATE OF DISCHARGE:  07/23/2005                                 DISCHARGE SUMMARY   DISCHARGE DIAGNOSES:  1.  End-stage renal disease on hemodialysis.      1.  Anemia of chronic disease.      2.  Secondary hyperparathyroidism.      3.  Hyperphosphatasemia.  2.  Status post arteriovenous graft on left arm.  3.  Status post Diatek catheter on the right internal jugular vein.   DISCHARGE MEDICATIONS:  1.  Calcium carbonate 500 mg 2 tablets 3 times a day with meals.  2.  Dialyvite 1 tablet p.o. daily.  3.  Epoetin 20,000 units 3 times a week with dialysis.  4.  Hectorol 1 mcg IV with every dialysis.   DISPOSITION:  The patient is to go home with dialysis Monday, Wednesday, and  Friday at [facility] on third shift.   PROCEDURES:  1.  July 13, 2005, placement of right internal jugular Diatek catheter with      ultrasound localization.  2.  Left forearm arteriovenous graft done on July 16, 2005.  3.  Chest x-ray on March 31 and April 1.  4.  Renal ultrasound done on April 1.  Impression: No evidence of      hydronephrosis, markedly echogenic kidneys with significant atrophy on      the left compatible with chronic medical renal disease.  Correlate      clinically.   CONSULTATIONS:  CVTS: Rosetta Posner, M.D. and Judeth Cornfield. Scot Dock, M.D.   HISTORY OF PRESENT ILLNESS:  A 40 year old Hispanic male with 2 to 3-week  history of nausea and fatigue.  He has also vomited. Outpatient laboratories  were drawn, and creatinine was found to be over 20.  He has no prior history  of renal disease.  His father had kidney disease and lives in [place] but is  not on dialysis.  He has not had any  difficulty voiding, hematuria, history  of kidney stone or kidney problem in the past.  He does not speak [place].   LABORATORY DATA ON ADMISSION:  Sodium 138, potassium 4.3, CO2 8, BUN 139,  creatinine 19.9.  PTT 31, INR 1.  Hemoglobin 8, white blood count 4000,  platelets 176.  Liver enzymes drawn with albumin of 4.  Urinalysis with 0 to  2 red blood cells, greater than 300 protein, no bacteria, granular casts  negative.   HOSPITAL COURSE:  #1.  END-STAGE RENAL DISEASE: The patient was started on hemodialysis  initially with a Diatek catheter.  After that, a graft was done on April 4.  He has been okay; however, we are waiting until this graft matures to start  using it.  The patient has been getting dialysis 4 hours every time with a  blood flow rate and dialysis rate 400/800, heparin tight, and his dry weight  is 63 kg.  The patient is completely asymptomatic, and he has been set to  get dialysis at [facility] next Friday.   SUBHEADING A: ANEMIA:  The patient required blood transfusion with packed  red blood cells 2 units, and he received also IV iron.  He was started on  Aranesp inpatient, and he will continue with Epogen as an outpatient each  dialysis.   SUBHEADING B:  SECONDARY HYPERPARATHYROIDISM:  He is receiving Hectorol for  this reason.  He is stable.   SUBHEADING C:  HYPERPHOSPHATEMIA:  He is receiving calcium carbonate 1000 mg  3 times a day with meals.   #2.  STATUS POST ARTERIOVENOUS GRAFT ON LEFT ARM:  The patient had some  edema in that area but is being treated with a sling on the left arm.   #3.  STATUS POST DIATEK CATHETER ON THE RIGHT INTERNAL JUGULAR VEIN: This  has remained without problem, no fevers from this point of view, and the  catheter site looks okay.   LABORATORY DATA AT DISCHARGE:  Sodium 136, potassium 4.6, chloride 101, CO2  22, glucose 101, BUN 61, creatinine 11.1, albumin 3.3, calcium 8.6,  phosphorus 5.8.  These were  drawn before dialysis at the time of his  discharge.  The labs post dialysis are pending.  CBC: White blood cell count  8.5, hemoglobin 10.1, hematocrit 28.7, MCV 98.1, platelet count 173.      Yong Channel, MD    ______________________________  Elzie Rings Lorrene Reid, M.D.    Darlin Coco  D:  07/23/2005  T:  07/23/2005  Job:  [id number]   cc:   Caren Griffins B. Lorrene Reid, M.D.  Fax: [phone]   Rosetta Posner, M.D.  [address]  [place]  [place] [address]   Christopher S. Scot Dock, M.D.  [address]  [place]  [place] [address]

## 2010-08-30 NOTE — Op Note (Signed)
NAME:  Logan [name] NO.:  [id number]   MEDICAL RECORD NO.:  [id number]          PATIENT TYPE:  INP   LOCATION:  5511                         FACILITY:  [facility]   PHYSICIAN:  Rosetta Posner, M.D.    DATE OF BIRTH:  Sep 09, 1970   DATE OF PROCEDURE:  DATE OF DISCHARGE:                                 OPERATIVE REPORT   PREOPERATIVE DIAGNOSIS:  End-stage renal disease.   POSTOPERATIVE DIAGNOSIS:  End-stage renal disease.   PROCEDURE:  Placement of right internal jugular Diatek catheter with  ultrasound visualization.   SURGEON:  Rosetta Posner, M.D.   ASSISTANT:  Nurse.   ANESTHESIA:  MAC.   COMPLICATIONS:  None.   PROCEDURE IN DETAIL:  The patient was taken to the op and placed in a supine  position, where the area of the right and left neck were imaged with  ultrasound.  The patient had widely patent jugular veins.  The patient was  placed in Trendelenburg position and the right and left neck and chest  prepped and draped in a sterile fashion using local anesthesia and a finder  needle.  The right internal jugular vein was identified and next, using  Seldinger technique, a guidewire was passed down to the level of the right  atrium.  A dilator and peel-away sheath was passed over the guidewire and  the dilator and guidewire were removed.  A 28 cm Diatek catheter was  positioned down the peel-away sheath, which was then removed as well.  The  catheter was positioned to the level of the superior vena cava-right atrial  junction.  The catheter was then brought through a subcutaneous tunnel  through a separate stab incision, the two lumen ports were attached, and  both lumens flushed and aspirated easily.  The catheter was secured to the  skin with 3-0 nylon stitch and the entry site was closed with 4-0  subcuticular Vicryl stitch.  The catheter was locked with 1000 units/mL  heparin.  The patient was returned to recovery room, where a chest x-ray is   obtained.      Rosetta Posner, M.D.  Electronically Signed     TFE/MEDQ  D:  07/13/2005  T:  07/14/2005  Job:  [id number]

## 2010-08-30 NOTE — Op Note (Signed)
NAME:  Clinton Quant NO.:  [id number]   MEDICAL RECORD NO.:  [id number]          PATIENT TYPE:  INP   LOCATION:  5511                         FACILITY:  [facility]   PHYSICIAN:  Judeth Cornfield. Scot Dock, M.D.DATE OF BIRTH:  1970-04-30   DATE OF PROCEDURE:  07/16/2005  DATE OF DISCHARGE:                                 OPERATIVE REPORT   PREOPERATIVE DIAGNOSIS:  Chronic renal failure.   POSTOPERATIVE DIAGNOSIS:  Chronic renal failure.   PROCEDURE:  Placement of new left forearm arteriovenous graft.   SURGEON:  Judeth Cornfield. Scot Dock, M.D.   ASSISTANT:  Jacinta Shoe, P.A.-C.   ANESTHESIA:  Local with sedation.   TECHNIQUE:  The patient was taken to the operating and sedated by  anesthesia.  The left upper extremity was prepped and draped in usual  sterile fashion.  After the skin was infiltrated with 1% lidocaine, a  longitudinal incision was made just above the antecubital level where the  artery was dissected free beneath the muscle. The artery was small and  spasmed.  The basilic vein was fairly good-sized.  It was ligated distally  and irrigated up nicely with heparinized saline and easily took a 5 mm  dilator.  Using one distal counter-incision after the skin was anesthetized,  the 4-7 mm graft was then tunneled in a loop fashion in the forearm with the  arterial aspect of the graft along the ulnar aspect of the forearm and the  venous aspect of the graft along the radial aspect of the forearm. The  patient was then heparinized.  The brachial artery was clamped proximally  and distally and a longitudinal arteriotomy was made.  Again, it was a small  artery and I did interrogate with the Doppler to be sure that this was, in  fact, the brachial artery.  A short segment of the 4-mm end of the graft was  excised, the graft spatulated, and sewn end-to-side to the brachial artery  using continuous 6-0 Prolene suture.  The graft was then pulled to the  appropriate length for anastomosis to the basilic vein.  The vein was  ligated distally and spatulated proximally.  The graft was cut to the  appropriate length, spatulated and sewn end-to-end to the vein using  continuous 6-0 Prolene suture.  At the completion, there was a good thrill  in the graft and good radial and ulnar flow with the Doppler.  Hemostasis  was obtained.  The heparin was partially reversed with protamine.  The  wounds were closed with a deep layer of 3-0 Vicryl, the skin closed with 4-0  Vicryl.  A sterile dressing was applied.  The patient tolerated procedure  well and was transferred to the recovery room in satisfactory condition.  All needle and sponge counts were correct.      Judeth Cornfield. Scot Dock, M.D.  Electronically Signed     CSD/MEDQ  D:  07/16/2005  T:  07/16/2005  Job:  [id number]

## 2010-10-17 ENCOUNTER — Ambulatory Visit (HOSPITAL_COMMUNITY)
Admission: RE | Admit: 2010-10-17 | Discharge: 2010-10-17 | Disposition: A | Discharge status: Home / Self Care | Payer: Self-pay | Source: Ambulatory Visit | Attending: Vascular Surgery | Admitting: Vascular Surgery

## 2010-10-17 ENCOUNTER — Ambulatory Visit (HOSPITAL_COMMUNITY): Payer: Self-pay

## 2010-10-17 DIAGNOSIS — I120 Hypertensive chronic kidney disease with stage 5 chronic kidney disease or end stage renal disease: Secondary | ICD-10-CM | POA: Insufficient documentation

## 2010-10-17 DIAGNOSIS — Y832 Surgical operation with anastomosis, bypass or graft as the cause of abnormal reaction of the patient, or of later complication, without mention of misadventure at the time of the procedure: Secondary | ICD-10-CM | POA: Insufficient documentation

## 2010-10-17 DIAGNOSIS — N186 End stage renal disease: Secondary | ICD-10-CM | POA: Insufficient documentation

## 2010-10-17 DIAGNOSIS — T82898A Other specified complication of vascular prosthetic devices, implants and grafts, initial encounter: Secondary | ICD-10-CM | POA: Insufficient documentation

## 2010-10-17 HISTORY — PX: AV FISTULA PLACEMENT, BRACHIOCEPHALIC: SHX1207

## 2010-10-17 LAB — SURGICAL PCR SCREEN
MRSA, PCR: NEGATIVE
Staphylococcus aureus: NEGATIVE

## 2010-10-31 NOTE — Op Note (Signed)
  Logan [name], LAURENCE NO.:  [id number]  MEDICAL RECORD NO.:  [id number]  LOCATION:  SDSC                         FACILITY:  [facility]  PHYSICIAN:  Rosetta Posner, M.D.    DATE OF BIRTH:  [birth year]  DATE OF PROCEDURE:  10/17/2010 DATE OF DISCHARGE:  10/17/2010                              OPERATIVE REPORT   PREOPERATIVE DIAGNOSIS:  End-stage renal disease with malfunctioning left arm arteriovenous fistula.  POSTOPERATIVE DIAGNOSIS:  End-stage renal disease with malfunctioning left arm arteriovenous fistula.  PROCEDURE:  New upper arm brachiocephalic fistula.  SURGEON:  Rosetta Posner, MD  ASSISTANT:  Evorn Gong, PA-C  ANESTHESIA:  LMA.  COMPLICATIONS:  None.  DISPOSITION:  To recovery room in stable.  The patient was taken to the operating room, placed in supine position. The area of the left arm was prepped and draped in usual sterile fashion.  Preoperative imaging of this shown degeneration of the forearm position of the radiocephalic fistula.  Then recommended that he have conversion of this to a brachiocephalic fistula.  Due to the degeneration of the forearm, portion of the fistula, the access center had been using the upper arm cephalic vein.  I explained this would give better inflow into this.  PROCEDURE IN DETAIL:  The patient was taken to the operating room, placed in supine position. The area of the left arm was prepped and draped in usual sterile fashion.  Incision was made over the antecubital space, carried down to isolate the cephalic vein at the antecubital level and the brachial artery.  Cephalic vein was mobilized proximally and distally and was ligated distally and divided.  This was mobilized at the level of brachial artery.  The patient also had a nonfunctioning old forearm loop graft with Gore-Tex.  The graft to brachial artery anastomosis was exposed.  The artery was occluded proximal and distal to this and the old graft  was excised.  The cephalic vein was brought into approximation and this was sewn end-to-side to the artery with a running 6-0 Prolene suture. Clamps were removed and good thrill was noted.  The wound was irrigated with saline.  Hemostasis obtained with electrocautery.  Wounds were closed with 3-0 Vicryl in the subcutaneous and subcuticular tissue. Benzoin and Steri-Strips were applied.     Rosetta Posner, M.D.     TFE/MEDQ  D:  10/17/2010  T:  10/18/2010  Job:  [id number]  Electronically Signed by Avereigh Spainhower M.D. on 10/31/2010 07:46:26 AM

## 2010-11-18 ENCOUNTER — Encounter: Payer: Self-pay | Admitting: Vascular Surgery

## 2010-11-19 ENCOUNTER — Ambulatory Visit (INDEPENDENT_AMBULATORY_CARE_PROVIDER_SITE_OTHER): Payer: Self-pay | Admitting: Vascular Surgery

## 2010-11-19 ENCOUNTER — Encounter: Payer: Self-pay | Admitting: Vascular Surgery

## 2010-11-19 ENCOUNTER — Ambulatory Visit: Payer: Self-pay | Admitting: Vascular Surgery

## 2010-11-19 VITALS — BP 141/97 | HR 89 | Resp 20 | Ht 63.0 in | Wt 156.4 lb

## 2010-11-19 DIAGNOSIS — N186 End stage renal disease: Secondary | ICD-10-CM

## 2010-11-19 NOTE — Progress Notes (Signed)
Subjective:     Patient ID: Logan [name], male   DOB: 1970/12/28, 40 y.o.   MRN: [id number]  HPI Here today for followup of brachial cephalic fistula created on 10/17/2010. He had had a radiocephalic fistula with good maturation of his upper arm vein. He is having successful use of his upper arm fistula. His antecubital incision is healed nicely.  Review of Systems    no change Objective:   Physical Exam  well-perfused left hand. Well-healed antecubital incision. Excellent thrill in the upper arm cephalic vein fistula.    Assessment:     Stable status post left upper arm AV fistula    Plan:     Continue fistula use and see [place] on an as-needed basis

## 2010-11-20 ENCOUNTER — Ambulatory Visit (INDEPENDENT_AMBULATORY_CARE_PROVIDER_SITE_OTHER): Payer: Self-pay | Admitting: Surgery

## 2010-11-26 ENCOUNTER — Encounter (INDEPENDENT_AMBULATORY_CARE_PROVIDER_SITE_OTHER): Payer: Self-pay | Admitting: Surgery

## 2010-11-26 ENCOUNTER — Ambulatory Visit (INDEPENDENT_AMBULATORY_CARE_PROVIDER_SITE_OTHER): Payer: Medicaid Other | Admitting: Surgery

## 2010-11-26 VITALS — BP 107/70 | HR 84 | Temp 99.3°F | Ht 64.0 in | Wt 154.4 lb

## 2010-11-26 DIAGNOSIS — N2581 Secondary hyperparathyroidism of renal origin: Secondary | ICD-10-CM | POA: Insufficient documentation

## 2010-11-26 NOTE — Progress Notes (Signed)
Chief Complaint  Patient presents with  . Chronic Renal Failure    suspect secondary hyperparathyroidism    HISTORY: Patient is a 40 year old Hispanic male who is referred by his nephrologist for evaluation of secondary hyperparathyroidism. Patient has end-stage renal disease. He started hemodialysis in 2007. His recent laboratory studies show an elevated intact PTH level of 2360. Calcium levels range from 10.0-10.6. Phosphorus levels range from 4.9-7.9.  Patient dialyzes at the [facility] on [facility]. He dialyzes on Mondays Wednesdays and Fridays. He is a AV graft is in the left upper arm.   Past Medical History  Diagnosis Date  . Renal failure   . Hypertension   . Anemia   . Chronic hepatitis      Current Outpatient Prescriptions  Medication Sig Dispense Refill  . calcium carbonate (OS-CAL) 1250 MG chewable tablet Chew 1 tablet by mouth daily.           No Known Allergies   No family history on file.   History   Social History  . Marital Status: Unknown    Spouse Name: N/A    Number of Children: N/A  . Years of Education: N/A   Social History Main Topics  . Smoking status: Former Research scientist (life sciences)  . Smokeless tobacco: None   Comment: quit 7 yrs ago  . Alcohol Use: Yes     social  . Drug Use: No  . Sexually Active: None   Other Topics Concern  . None   Social History Narrative  . None     REVIEW OF SYSTEMS - PERTINENT POSITIVES ONLY: None.   EXAM: Filed Vitals:   11/26/10 1012  BP: 107/70  Pulse: 84  Temp: 99.3 F (37.4 C)    HEENT: normocephalic; pupils equal and reactive; sclerae clear; dentition good; mucous membranes moist NECK:  No palpable masses; symmetric on extension; no palpable anterior or posterior cervical lymphadenopathy; no supraclavicular masses; no tenderness CHEST: clear to auscultation bilaterally without rales, rhonchi, or wheezes CARDIAC: regular rate and rhythm without significant murmur; peripheral pulses are  full EXT:  non-tender without edema; no deformity; AVG in left upper arm; non-functioning graft in left forearm NEURO: no gross focal deficits; no sign of tremor   LABORATORY RESULTS: See E-Chart for most recent results   RADIOLOGY RESULTS: See E-Chart or I-Site for most recent results   IMPRESSION: #1-secondary hyperparathyroidism #2-end-stage renal disease on hemodialysis #3-hypertension #4-chronic hepatitis C.   PLAN: With the use of an interpreter, I discussed the above findings with the patient. I explained to him the procedure of total parathyroidectomy and autotransplantation to the right forearm. We discussed potential complications including injury to recurrent laryngeal nerves with voice changes. We discussed the hospital stay to be anticipated. He understands and agrees to proceed.  The risks and benefits of the procedure have been discussed at length with the patient.  The patient understands the proposed procedure, potential alternative treatments, and the course of recovery to be expected.  All of the patient's questions have been answered at this time.  The patient wishes to proceed with surgery and will schedule a date for their procedure through our office staff.    Earnstine Regal, MD, [facility]      Visit Diagnoses: 1. Hyperparathyroidism, secondary     Primary Care Physician: Placido Sou, MD

## 2011-01-07 ENCOUNTER — Encounter (HOSPITAL_COMMUNITY)
Admission: RE | Admit: 2011-01-07 | Discharge: 2011-01-07 | Disposition: A | Discharge status: Home / Self Care | Payer: Medicaid Other | Source: Ambulatory Visit | Attending: Surgery | Admitting: Surgery

## 2011-01-07 LAB — SURGICAL PCR SCREEN: Staphylococcus aureus: POSITIVE — AB

## 2011-01-14 ENCOUNTER — Ambulatory Visit (HOSPITAL_COMMUNITY)
Admission: RE | Admit: 2011-01-14 | Discharge: 2011-01-14 | Disposition: A | Discharge status: Home / Self Care | Payer: Medicaid Other | Source: Ambulatory Visit | Attending: Surgery | Admitting: Surgery

## 2011-01-14 ENCOUNTER — Other Ambulatory Visit (INDEPENDENT_AMBULATORY_CARE_PROVIDER_SITE_OTHER): Payer: Self-pay | Admitting: Surgery

## 2011-01-14 ENCOUNTER — Inpatient Hospital Stay (HOSPITAL_COMMUNITY)
Admission: RE | Admit: 2011-01-14 | Discharge: 2011-01-19 | DRG: 674 | Disposition: A | Discharge status: Home / Self Care | Payer: Medicaid Other | Source: Ambulatory Visit | Attending: Surgery | Admitting: Surgery

## 2011-01-14 ENCOUNTER — Inpatient Hospital Stay (HOSPITAL_COMMUNITY): Payer: Medicaid Other

## 2011-01-14 DIAGNOSIS — Z01812 Encounter for preprocedural laboratory examination: Secondary | ICD-10-CM

## 2011-01-14 DIAGNOSIS — Z992 Dependence on renal dialysis: Secondary | ICD-10-CM

## 2011-01-14 DIAGNOSIS — E215 Disorder of parathyroid gland, unspecified: Secondary | ICD-10-CM

## 2011-01-14 DIAGNOSIS — B1920 Unspecified viral hepatitis C without hepatic coma: Secondary | ICD-10-CM | POA: Diagnosis present

## 2011-01-14 DIAGNOSIS — N2581 Secondary hyperparathyroidism of renal origin: Principal | ICD-10-CM | POA: Diagnosis present

## 2011-01-14 DIAGNOSIS — I120 Hypertensive chronic kidney disease with stage 5 chronic kidney disease or end stage renal disease: Secondary | ICD-10-CM | POA: Diagnosis present

## 2011-01-14 DIAGNOSIS — E213 Hyperparathyroidism, unspecified: Secondary | ICD-10-CM

## 2011-01-14 DIAGNOSIS — D649 Anemia, unspecified: Secondary | ICD-10-CM | POA: Diagnosis present

## 2011-01-14 DIAGNOSIS — N186 End stage renal disease: Secondary | ICD-10-CM

## 2011-01-14 DIAGNOSIS — Z01818 Encounter for other preprocedural examination: Secondary | ICD-10-CM

## 2011-01-14 HISTORY — PX: PARATHYROIDECTOMY: SHX19

## 2011-01-14 LAB — BASIC METABOLIC PANEL
CO2: 30 mEq/L (ref 19–32)
GFR calc non Af Amer: 4 mL/min — ABNORMAL LOW (ref >90)
Glucose, Bld: 100 mg/dL — ABNORMAL HIGH (ref 70–99)
Potassium: 4.9 mEq/L (ref 3.5–5.1)
Sodium: 140 mEq/L (ref 135–145)

## 2011-01-14 LAB — CBC
HCT: 44.1 % (ref 39.0–52.0)
MCV: 89.5 fL (ref 78.0–100.0)
Platelets: 170 10*3/uL (ref 150–400)
RBC: 4.93 MIL/uL (ref 4.22–5.81)
WBC: 6.4 10*3/uL (ref 4.0–10.5)

## 2011-01-14 LAB — RENAL FUNCTION PANEL
Albumin: 3.4 g/dL — ABNORMAL LOW (ref 3.5–5.2)
CO2: 23 mEq/L (ref 19–32)
Calcium: 8.8 mg/dL (ref 8.4–10.5)
GFR calc Af Amer: 4 mL/min — ABNORMAL LOW (ref >90)
GFR calc non Af Amer: 4 mL/min — ABNORMAL LOW (ref >90)
Sodium: 136 mEq/L (ref 135–145)

## 2011-01-14 LAB — DIFFERENTIAL
Lymphocytes Relative: 28 % (ref 12–46)
Lymphs Abs: 1.8 10*3/uL (ref 0.7–4.0)
Neutrophils Relative %: 60 % (ref 43–77)

## 2011-01-14 LAB — PROTIME-INR: INR: 0.97 (ref 0.00–1.49)

## 2011-01-14 LAB — CALCIUM

## 2011-01-14 LAB — PHOSPHORUS

## 2011-01-15 LAB — RENAL FUNCTION PANEL
Albumin: 3.3 g/dL — ABNORMAL LOW (ref 3.5–5.2)
BUN: 42 mg/dL — ABNORMAL HIGH (ref 6–23)
Calcium: 7 mg/dL — ABNORMAL LOW (ref 8.4–10.5)
Creatinine, Ser: 10.02 mg/dL — ABNORMAL HIGH (ref 0.50–1.35)
Glucose, Bld: 110 mg/dL — ABNORMAL HIGH (ref 70–99)
Phosphorus: 5.5 mg/dL — ABNORMAL HIGH (ref 2.3–4.6)
Potassium: 3.8 mEq/L (ref 3.5–5.1)

## 2011-01-15 LAB — POTASSIUM: Potassium: 4.3 mEq/L (ref 3.5–5.1)

## 2011-01-15 LAB — CALCIUM: Calcium: 6.5 mg/dL — ABNORMAL LOW (ref 8.4–10.5)

## 2011-01-15 LAB — PHOSPHORUS: Phosphorus: 4.3 mg/dL (ref 2.3–4.6)

## 2011-01-16 LAB — RENAL FUNCTION PANEL
Calcium: 5.9 mg/dL — CL (ref 8.4–10.5)
Creatinine, Ser: 14.89 mg/dL — ABNORMAL HIGH (ref 0.50–1.35)
GFR calc Af Amer: 4 mL/min — ABNORMAL LOW (ref >90)
GFR calc non Af Amer: 4 mL/min — ABNORMAL LOW (ref >90)
Phosphorus: 4 mg/dL (ref 2.3–4.6)
Sodium: 134 mEq/L — ABNORMAL LOW (ref 135–145)

## 2011-01-16 LAB — PHOSPHORUS: Phosphorus: 4.3 mg/dL (ref 2.3–4.6)

## 2011-01-16 LAB — CBC
MCH: 30.5 pg (ref 26.0–34.0)
MCHC: 33.9 g/dL (ref 30.0–36.0)
MCV: 90 fL (ref 78.0–100.0)
Platelets: 140 10*3/uL — ABNORMAL LOW (ref 150–400)

## 2011-01-16 LAB — CALCIUM: Calcium: 9 mg/dL (ref 8.4–10.5)

## 2011-01-16 LAB — POTASSIUM: Potassium: 4.4 mEq/L (ref 3.5–5.1)

## 2011-01-17 LAB — DIFFERENTIAL
Basophils Absolute: 0 10*3/uL (ref 0.0–0.1)
Basophils Relative: 0 % (ref 0–1)
Eosinophils Absolute: 0.1 10*3/uL (ref 0.0–0.7)
Eosinophils Relative: 1 % (ref 0–5)
Neutrophils Relative %: 85 % — ABNORMAL HIGH (ref 43–77)

## 2011-01-17 LAB — POTASSIUM: Potassium: 3.8 mEq/L (ref 3.5–5.1)

## 2011-01-17 LAB — CBC
Hemoglobin: 13.3 g/dL (ref 13.0–17.0)
MCHC: 33.7 g/dL (ref 30.0–36.0)
RBC: 4.09 MIL/uL — ABNORMAL LOW (ref 4.22–5.81)
WBC: 8.9 10*3/uL (ref 4.0–10.5)

## 2011-01-17 LAB — COMPREHENSIVE METABOLIC PANEL
ALT: 29 U/L (ref 0–53)
AST: 37 U/L (ref 0–37)
Alkaline Phosphatase: 95 U/L (ref 39–117)
CO2: 33 mEq/L — ABNORMAL HIGH (ref 19–32)
Calcium: 8.9 mg/dL (ref 8.4–10.5)
Chloride: 98 mEq/L (ref 96–112)
GFR calc Af Amer: 11 mL/min — ABNORMAL LOW (ref >60)
GFR calc non Af Amer: 9 mL/min — ABNORMAL LOW (ref >60)
Glucose, Bld: 107 mg/dL — ABNORMAL HIGH (ref 70–99)
Sodium: 137 mEq/L (ref 135–145)
Total Bilirubin: 0.8 mg/dL (ref 0.3–1.2)

## 2011-01-17 LAB — RENAL FUNCTION PANEL
Albumin: 3.2 g/dL — ABNORMAL LOW (ref 3.5–5.2)
BUN: 43 mg/dL — ABNORMAL HIGH (ref 6–23)
CO2: 27 mEq/L (ref 19–32)
Chloride: 96 mEq/L (ref 96–112)
Creatinine, Ser: 10.08 mg/dL — ABNORMAL HIGH (ref 0.50–1.35)
GFR calc non Af Amer: 6 mL/min — ABNORMAL LOW (ref >90)
Potassium: 4.3 mEq/L (ref 3.5–5.1)

## 2011-01-17 LAB — LIPASE, BLOOD: Lipase: 48 U/L (ref 11–59)

## 2011-01-18 ENCOUNTER — Inpatient Hospital Stay (HOSPITAL_COMMUNITY): Payer: Medicaid Other

## 2011-01-18 LAB — PHOSPHORUS: Phosphorus: 3.7 mg/dL (ref 2.3–4.6)

## 2011-01-18 LAB — CALCIUM
Calcium: 6.3 mg/dL — CL (ref 8.4–10.5)
Calcium: 5.9 mg/dL — CL (ref 8.4–10.5)

## 2011-01-18 LAB — POTASSIUM: Potassium: 4.2 mEq/L (ref 3.5–5.1)

## 2011-01-19 LAB — RENAL FUNCTION PANEL
Albumin: 3.6 g/dL (ref 3.5–5.2)
BUN: 44 mg/dL — ABNORMAL HIGH (ref 6–23)
Chloride: 94 mEq/L — ABNORMAL LOW (ref 96–112)
GFR calc non Af Amer: 7 mL/min — ABNORMAL LOW (ref >90)
Phosphorus: 4.5 mg/dL (ref 2.3–4.6)
Potassium: 4.2 mEq/L (ref 3.5–5.1)
Sodium: 138 mEq/L (ref 135–145)

## 2011-01-19 LAB — PHOSPHORUS: Phosphorus: 3.2 mg/dL (ref 2.3–4.6)

## 2011-01-19 LAB — CALCIUM: Calcium: 7.6 mg/dL — ABNORMAL LOW (ref 8.4–10.5)

## 2011-01-20 NOTE — Op Note (Signed)
NAMEMarland Kitchen  CHACE, SNEARY NO.:  [id number]  MEDICAL RECORD NO.:  [id number]  LOCATION:  XRAY                         FACILITY:  [facility]  PHYSICIAN:  Earnstine Regal, MD      DATE OF BIRTH:  09-14-1970  DATE OF PROCEDURE:  01/14/2011                               OPERATIVE REPORT   PREOPERATIVE DIAGNOSES: 1. Secondary hyperparathyroidism. 2. End-stage renal disease.  POSTOPERATIVE DIAGNOSES: 1. Secondary hyperparathyroidism. 2. End-stage renal disease.  PROCEDURES: 1. Total parathyroidectomy (four glands). 2. Autotransplantation parathyroid tissue to right brachioradialis     muscle.  SURGEON:  Earnstine Regal, MD, FACS  ASSISTANT:  Adin Hector, MD  ANESTHESIA:  General per Dr. Leda Quail.  ESTIMATED BLOOD LOSS:  Minimal.  PREPARATION:  ChloraPrep.  COMPLICATIONS:  None.  INDICATIONS:  The patient is a 40 year old Hispanic male with end-stage renal disease and biochemical evidence of secondary hyperparathyroidism. He now comes to surgery for parathyroidectomy with autotransplantation.  BODY OF REPORT:  Procedure was done in OR #17 at the [facility].  The patient was brought to the operating room, placed in a supine position on the operating room table.  Following administration of general anesthesia, the patient was positioned and then prepped and draped in the usual strict aseptic fashion.  After ascertaining that an adequate level of anesthesia had been achieved, a Kocher incision was made with a #15 blade.  Dissection was carried through subcutaneous tissues and platysma.  Hemostasis was obtained with electrocautery.  Skin flaps were elevated cephalad and caudad from the thyroid notch to the sternal notch.  A Mahorner self-retaining retractor was placed for exposure.  Strap muscles were incised in the midline. Dissection was begun on the left side.  There was an enlarged left inferior parathyroid gland.  This was gently  dissected out.  Vascular structures were divided between small Ligaclips.  Gland was excised in its entirety.  A fragment of tissue was excised and submitted to Pathology for frozen section confirming hypercellular parathyroid tissue.  Remainder of the gland was placed in iced saline on the back table.  Further dissection in the left side of the neck revealed a slightly enlarged parathyroid gland, which was quite firm and nodular just above the level of the inferior thyroid artery on the left.  This was gently dissected out of the tracheoesophageal groove, taking care to avoid the underlying recurrent laryngeal nerve.  Vascular structures were divided between small Ligaclips and the gland was excised.  The entire gland was submitted to Pathology for frozen section, which confirmed hypercellular parathyroid tissue.  Dry pack was placed in the left neck.  Next, we turned our attention to the right side.  Again strap muscles were reflected laterally.  Right lobe was mobilized.  In the right superior position just above the level of the inferior thyroid artery was a hard nodular parathyroid gland.  This was gently dissected out. The gland was sectioned on the back table and the biopsy submitted to Pathology confirming hypercellular parathyroid tissue.  Remainder of the gland was placed in iced saline on the back table.  Good hemostasis was noted at the site.  Further dissection in the right neck failed to reveal an obvious inferior gland on  the right side.  The thyroid thymic tract was then identified and opened and dissected into the anterior mediastinum.  An abnormally-enlarged parathyroid gland was identified in the lower portion of the thyroid thymic tract in the anterior mediastinum.  It was gently mobilized.  Vascular structures were divided between medium Ligaclips with electrocautery and the entire thyroid thymic tract was excised.  There appeared to be a 2-cm parathyroid gland  present.  Biopsy was submitted to Pathology and confirmed hypercellular parathyroid tissue.  Remainder of the gland was placed in iced saline on the back table.  Neck was irrigated bilaterally.  Good hemostasis was noted throughout the operative field.  Surgicel was placed throughout the operative field bilaterally.  Strap muscles were reapproximated in the midline with interrupted 3-0 Vicryl sutures.  Platysma was closed with interrupted 3- 0 Vicryl sutures.  Skin was closed with a running 4-0 Monocryl subcuticular suture.  Wound was dressed with Benzoin and Steri-Strips. Sterile dressings were applied.  Next, the right forearm was placed on an arm board at 90 degrees to the patient.  Skin was prepped and draped in the usual aseptic fashion. After ascertaining that an adequate level of anesthesia had been maintained, the skin incision was made with a #15 blade for approximately 5 cm over the brachioradialis muscle.  Skin flaps were elevated circumferentially and a Weitlaner retractor placed for exposure.  The right superior parathyroid gland was prepared by sectioning it into 10 1-mm fragments kept in iced saline.  Each fragment was then inserted into the right brachioradialis muscle by incising the muscle fascia, creating a submuscular pocket for approximately 3-4 mm into the muscle, inserting a 1-mm fragment, and closing the overlying muscle fascia with interrupted 4-0 Prolene simple sutures.  This exercise was repeated 10 times.  Subcutaneous tissues were closed with interrupted 3-0 Vicryl sutures. Skin was closed with running 4-0 Monocryl subcuticular suture.  Wound was washed and dried and Benzoin and Steri-Strips were applied.  Sterile dressings were applied.  The patient was awakened from anesthesia and brought to the recovery room.  The patient tolerated the procedure well.   Earnstine Regal, MD, FACS     TMG/MEDQ  D:  01/14/2011  T:  01/14/2011  Job:  [id number]  cc:    Jeneen Rinks L. Deterding, M.D.  Electronically Signed by Armandina Gemma MD on 01/20/2011 11:39:24 AM

## 2011-01-30 ENCOUNTER — Emergency Department (HOSPITAL_COMMUNITY)
Admission: EM | Admit: 2011-01-30 | Discharge: 2011-01-30 | Disposition: A | Discharge status: Home / Self Care | Payer: Self-pay | Attending: Emergency Medicine | Admitting: Emergency Medicine

## 2011-01-30 DIAGNOSIS — R209 Unspecified disturbances of skin sensation: Secondary | ICD-10-CM | POA: Insufficient documentation

## 2011-01-30 DIAGNOSIS — Z9889 Other specified postprocedural states: Secondary | ICD-10-CM | POA: Insufficient documentation

## 2011-01-30 DIAGNOSIS — N289 Disorder of kidney and ureter, unspecified: Secondary | ICD-10-CM | POA: Insufficient documentation

## 2011-01-30 LAB — CBC
HCT: 42.9 % (ref 39.0–52.0)
Hemoglobin: 14.4 g/dL (ref 13.0–17.0)
MCH: 30.5 pg (ref 26.0–34.0)
MCHC: 33.6 g/dL (ref 30.0–36.0)
MCV: 90.9 fL (ref 78.0–100.0)
Platelets: 221 K/uL (ref 150–400)
RBC: 4.72 MIL/uL (ref 4.22–5.81)
RDW: 14.4 % (ref 11.5–15.5)
WBC: 7.5 K/uL (ref 4.0–10.5)

## 2011-01-30 LAB — DIFFERENTIAL
Basophils Absolute: 0 10*3/uL (ref 0.0–0.1)
Basophils Relative: 0 % (ref 0–1)
Eosinophils Absolute: 0.2 K/uL (ref 0.0–0.7)
Eosinophils Relative: 3 % (ref 0–5)
Lymphocytes Relative: 26 % (ref 12–46)
Lymphs Abs: 2 K/uL (ref 0.7–4.0)
Monocytes Absolute: 0.6 10*3/uL (ref 0.1–1.0)
Monocytes Relative: 8 % (ref 3–12)
Neutro Abs: 4.7 K/uL (ref 1.7–7.7)
Neutrophils Relative %: 63 % (ref 43–77)

## 2011-01-30 LAB — POCT I-STAT, CHEM 8
BUN: 53 mg/dL — ABNORMAL HIGH (ref 6–23)
Calcium, Ion: 0.92 mmol/L — ABNORMAL LOW (ref 1.12–1.32)
Chloride: 97 meq/L (ref 96–112)
Creatinine, Ser: 10.3 mg/dL — ABNORMAL HIGH (ref 0.50–1.35)
Glucose, Bld: 83 mg/dL (ref 70–99)
HCT: 47 % (ref 39.0–52.0)
Hemoglobin: 16 g/dL (ref 13.0–17.0)
Potassium: 4.7 mEq/L (ref 3.5–5.1)
Sodium: 139 mEq/L (ref 135–145)
TCO2: 30 mmol/L (ref 0–100)

## 2011-02-10 ENCOUNTER — Encounter (INDEPENDENT_AMBULATORY_CARE_PROVIDER_SITE_OTHER): Payer: Self-pay | Admitting: Surgery

## 2011-02-13 NOTE — Discharge Summary (Signed)
  NAMEFIELD, HABERBERGER NO.:  [id number]  MEDICAL RECORD NO.:  [id number]  LOCATION:  [id number]                         FACILITY:  [facility]  PHYSICIAN:  Earnstine Regal, MD      DATE OF BIRTH:  1970-08-19  DATE OF ADMISSION:  01/14/2011 DATE OF DISCHARGE:  01/19/2011                              DISCHARGE SUMMARY   REASON FOR ADMISSION:  End-stage renal disease, secondary hyperparathyroidism.  BRIEF HISTORY:  The patient is a 40 year old Hispanic male with end- stage renal disease.  He has biochemical evidence of secondary hyperparathyroidism.  He is referred by Nephrology for total parathyroidectomy with autotransplantation.  HOSPITAL COURSE:  The patient was admitted on January 14, 2011.  He was taken directly to the operating room where he underwent total parathyroidectomy with autotransplantation of parathyroid tissue to the right brachioradialis muscle.  Postoperative course was essentially straightforward.  He was seen in consultation by Nephrology.  He had a significant fall in his serum calcium levels.  Wound healing was uneventful.  The patient received hemodialysis.  Calcium levels fell to a low of 5.9.  He received intravenous supplementation and adjustments in his hemodialysis regimen.  The patient stabilized, gradually improved, and was prepared for discharge home on the fifth postoperative day.  DISCHARGE PLANNING:  The patient is discharged home on January 19, 2011, in good condition, tolerating his renal diet, and ambulating independently.  DISCHARGE MEDICATIONS:  Vicodin as needed for pain.  The patient will be seen back in my office at [facility] in 2 weeks for wound check.  He will be followed closely by Nephrology at his dialysis sessions.  FINAL DIAGNOSES:  Secondary hyperparathyroidism, end-stage renal disease, final pathologic results pending at the time of dictation.  CONDITION AT DISCHARGE:  Good.     Earnstine Regal,  MD     TMG/MEDQ  D:  02/11/2011  T:  02/11/2011  Job:  [id number]  Electronically Signed by Armandina Gemma MD on 02/13/2011 12:35:06 PM

## 2011-03-05 ENCOUNTER — Encounter (INDEPENDENT_AMBULATORY_CARE_PROVIDER_SITE_OTHER): Payer: Self-pay | Admitting: Surgery

## 2011-03-13 ENCOUNTER — Ambulatory Visit (INDEPENDENT_AMBULATORY_CARE_PROVIDER_SITE_OTHER): Payer: Self-pay | Admitting: Surgery

## 2011-03-13 ENCOUNTER — Encounter (INDEPENDENT_AMBULATORY_CARE_PROVIDER_SITE_OTHER): Payer: Self-pay | Admitting: Surgery

## 2011-03-13 VITALS — BP 122/76 | HR 64 | Temp 97.2°F | Resp 16 | Ht 64.0 in | Wt 159.0 lb

## 2011-03-13 DIAGNOSIS — N2581 Secondary hyperparathyroidism of renal origin: Secondary | ICD-10-CM

## 2011-03-13 NOTE — Patient Instructions (Signed)
  COCOA BUTTER & VITAMIN E CREAM  ([employer] or other brand)  Apply cocoa butter/vitamin E cream to your incision 2 - 3 times daily.  Massage cream into incision for one minute with each application.  Use sunscreen (50 SPF or higher) for first 6 months after surgery.  You may substitute Mederma or other scar reducing creams as desired.   

## 2011-03-13 NOTE — Progress Notes (Signed)
Visit Diagnoses: 1. Hyperparathyroidism, secondary     HISTORY: Patient returns for his first postoperative visit after undergoing total parathyroidectomy with autotransplantation to the right forearm. He reports through a translator that he is doing well. He has no complaints. He has no pain. Apparently his laboratory studies have improved but the nephrologist is still adjusting his medications at this time.  EXAM: Neck incision is well healed with good cosmetic result. Voice quality is normal. Right forearm incision is well-healed. No seroma. No infection.  IMPRESSION: Status post total parathyroidectomy with autotransplantation to the right forearm for management of secondary hyperparathyroidism  PLAN: Patient will continue to apply topical creams to his incisions. He will followup with his nephrologist. He will return to see me as needed.   Earnstine Regal, MD, [facility]

## 2011-04-18 ENCOUNTER — Encounter (INDEPENDENT_AMBULATORY_CARE_PROVIDER_SITE_OTHER): Payer: Self-pay

## 2011-06-09 ENCOUNTER — Other Ambulatory Visit: Payer: Self-pay | Admitting: *Deleted

## 2011-06-09 ENCOUNTER — Ambulatory Visit (INDEPENDENT_AMBULATORY_CARE_PROVIDER_SITE_OTHER): Payer: Medicaid Other | Admitting: Surgery

## 2011-06-09 ENCOUNTER — Encounter: Payer: Self-pay | Admitting: Surgery

## 2011-06-09 ENCOUNTER — Encounter: Payer: Self-pay | Admitting: *Deleted

## 2011-06-09 VITALS — BP 106/65 | HR 79 | Temp 98.1°F | Ht 63.0 in | Wt 170.0 lb

## 2011-06-09 DIAGNOSIS — N186 End stage renal disease: Secondary | ICD-10-CM

## 2011-06-09 NOTE — Progress Notes (Signed)
Vascular and Vein Specialist of [facility]   Patient name: Logan [name] MRN: [id number] DOB: [birth year] Sex: male     Chief Complaint  Patient presents with  . 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 He currently dialyzes through a left brachiocephalic fistula. He claims that there is infection within his fistula. He denies having any fevers or drainage.  Past Medical History  Diagnosis Date  . Dialysis patient     MONDAY, WEDNESDAY, [place]  . Anemia   . Chronic kidney disease   . History of hypoparathyroidism   . Hypertension   . History of hepatitis C   . End stage renal disease     Past Surgical History  Procedure Date  . Av fistula placement, brachiocephalic [id number]    Left arm by Dr. Donnetta Hutching    History   Social History  . Marital Status: Married    Spouse Name: N/A    Number of Children: N/A  . Years of Education: N/A   Occupational History  . Not on file.   Social History Main Topics  . Smoking status: Former Smoker -- 1.0 packs/day for 3 years    Quit date: 11/19/1995  . Smokeless tobacco: Not on file  . Alcohol Use: No  . Drug Use: No  . Sexually Active: Not on file   Other Topics Concern  . Not on file   Social History Narrative  . No narrative on file    No family history on file.  Allergies as of 06/09/2011  . (No Known Allergies)    Current Outpatient Prescriptions on File Prior to Visit  Medication Sig Dispense Refill  . calcium carbonate (TUMS - DOSED IN MG ELEMENTAL CALCIUM) 500 MG chewable tablet Chew 1 tablet by mouth daily.        [name] docusate sodium (COLACE) 100 MG capsule Take 100 mg by mouth 2 (two) times daily.        [name] ethyl chloride spray Apply topically as needed.        . folic acid-vitamin b complex-vitamin  c-selenium-zinc (DIALYVITE) 3 MG TABS Take 1 tablet by mouth daily.        . cinacalcet (SENSIPAR) 90 MG tablet Take 90 mg by mouth daily.        . hydrOXYzine (ATARAX) 25 MG tablet Take 25 mg by mouth 3 (three) times daily as needed.        [name] lanthanum (FOSRENOL) 1000 MG chewable tablet Chew 1,000 mg by mouth 2 (two) times daily with a meal.        . lisinopril (PRINIVIL,ZESTRIL) 10 MG tablet Take 10 mg by mouth daily.           REVIEW OF SYSTEMS: No chest pain or shortness of breath no fevers chills. Positive for left wrist pain over his fistula. The patient is Spanish-speaking and communication is somewhat limited   PHYSICAL EXAMINATION:   Vital signs are BP 106/65  Pulse 79  Temp(Src) 98.1 F (36.7 C) (Oral)  Ht 5\' 3"  (1.6 m)  Wt 170 lb (77.111 kg)  BMI 30.11 kg/m2 General: The patient appears their stated age. HEENT:  No gross abnormalities Pulmonary:  Non labored breathing Musculoskeletal: There are no major deformities. Neurologic: No focal weakness or  paresthesias are detected, Skin: There are no ulcer or rashes noted. Psychiatric: The patient has normal affect. Cardiovascular: There is a functioning left brachiocephalic fistula. The left radiocephalic fistula has an aneurysm which is raised and red. There are no skin defects. 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 I discussed making a longitudinal incision of the aneurysm and resecting the involved portion. As we done this coming Friday it is a dialysis patient will have to rearrange his schedule.  Eldridge Abrahams, M.D. [facility] [place] Office: [phone] Pager:  [phone]

## 2011-06-10 ENCOUNTER — Ambulatory Visit: Payer: Medicaid Other | Admitting: Vascular Surgery

## 2011-06-11 ENCOUNTER — Encounter (HOSPITAL_COMMUNITY): Payer: Self-pay

## 2011-06-11 ENCOUNTER — Encounter (HOSPITAL_COMMUNITY): Payer: Self-pay | Admitting: Pharmacy Technician

## 2011-06-11 MED ORDER — SODIUM CHLORIDE 0.9 % IV SOLN
INTRAVENOUS | Status: DC
  Administered 2011-06-12: 35 mL/h via INTRAVENOUS

## 2011-06-11 MED ORDER — DEXTROSE 5 % IV SOLN
1.5000 g | INTRAVENOUS | Status: AC
  Administered 2011-06-12: 1.5 g via INTRAVENOUS
  Filled 2011-06-11: qty 1.5

## 2011-06-12 ENCOUNTER — Encounter (HOSPITAL_COMMUNITY): Payer: Self-pay | Admitting: Anesthesiology

## 2011-06-12 ENCOUNTER — Ambulatory Visit (HOSPITAL_COMMUNITY): Payer: Self-pay | Admitting: Anesthesiology

## 2011-06-12 ENCOUNTER — Encounter (HOSPITAL_COMMUNITY): Payer: Self-pay | Admitting: *Deleted

## 2011-06-12 ENCOUNTER — Ambulatory Visit (HOSPITAL_COMMUNITY)
Admission: RE | Admit: 2011-06-12 | Discharge: 2011-06-12 | Disposition: A | Discharge status: Home / Self Care | Payer: Self-pay | Source: Ambulatory Visit | Attending: Surgery | Admitting: Surgery

## 2011-06-12 ENCOUNTER — Encounter (HOSPITAL_COMMUNITY)
Admission: RE | Disposition: A | Discharge status: Home / Self Care | Payer: Self-pay | Source: Ambulatory Visit | Attending: Surgery

## 2011-06-12 DIAGNOSIS — D649 Anemia, unspecified: Secondary | ICD-10-CM | POA: Insufficient documentation

## 2011-06-12 DIAGNOSIS — Z79899 Other long term (current) drug therapy: Secondary | ICD-10-CM | POA: Insufficient documentation

## 2011-06-12 DIAGNOSIS — Z09 Encounter for follow-up examination after completed treatment for conditions other than malignant neoplasm: Secondary | ICD-10-CM

## 2011-06-12 DIAGNOSIS — N186 End stage renal disease: Secondary | ICD-10-CM | POA: Insufficient documentation

## 2011-06-12 DIAGNOSIS — T827XXA Infection and inflammatory reaction due to other cardiac and vascular devices, implants and grafts, initial encounter: Secondary | ICD-10-CM | POA: Insufficient documentation

## 2011-06-12 DIAGNOSIS — Y832 Surgical operation with anastomosis, bypass or graft as the cause of abnormal reaction of the patient, or of later complication, without mention of misadventure at the time of the procedure: Secondary | ICD-10-CM | POA: Insufficient documentation

## 2011-06-12 DIAGNOSIS — T82898A Other specified complication of vascular prosthetic devices, implants and grafts, initial encounter: Secondary | ICD-10-CM

## 2011-06-12 DIAGNOSIS — Z992 Dependence on renal dialysis: Secondary | ICD-10-CM | POA: Insufficient documentation

## 2011-06-12 DIAGNOSIS — B1920 Unspecified viral hepatitis C without hepatic coma: Secondary | ICD-10-CM | POA: Insufficient documentation

## 2011-06-12 DIAGNOSIS — I120 Hypertensive chronic kidney disease with stage 5 chronic kidney disease or end stage renal disease: Secondary | ICD-10-CM | POA: Insufficient documentation

## 2011-06-12 HISTORY — DX: Other complications of anesthesia, initial encounter: T88.59XA

## 2011-06-12 HISTORY — DX: Nausea with vomiting, unspecified: R11.2

## 2011-06-12 HISTORY — DX: Other specified postprocedural states: Z98.890

## 2011-06-12 HISTORY — DX: Adverse effect of unspecified anesthetic, initial encounter: T41.45XA

## 2011-06-12 SURGERY — REVISON OF ARTERIOVENOUS FISTULA
Anesthesia: General | Site: Arm Lower | Laterality: Left | Wound class: Dirty or Infected

## 2011-06-12 MED ORDER — FENTANYL CITRATE 0.05 MG/ML IJ SOLN
INTRAMUSCULAR | Status: DC | PRN
  Administered 2011-06-12: 50 ug via INTRAVENOUS
  Administered 2011-06-12: 100 ug via INTRAVENOUS
  Administered 2011-06-12: 50 ug via INTRAVENOUS

## 2011-06-12 MED ORDER — PHENYLEPHRINE HCL 10 MG/ML IJ SOLN
10.0000 mg | INTRAVENOUS | Status: DC | PRN
  Administered 2011-06-12: 50 ug/min via INTRAVENOUS

## 2011-06-12 MED ORDER — SCOPOLAMINE 1 MG/3DAYS TD PT72
1.0000 | MEDICATED_PATCH | TRANSDERMAL | Status: DC
  Administered 2011-06-12: 1.5 mg via TRANSDERMAL
  Administered 2011-06-12: 1 via TRANSDERMAL

## 2011-06-12 MED ORDER — SODIUM CHLORIDE 0.9 % IV SOLN: INTRAVENOUS | Status: DC

## 2011-06-12 MED ORDER — MORPHINE SULFATE 4 MG/ML IJ SOLN: 0.0500 mg/kg | INTRAMUSCULAR | Status: DC | PRN

## 2011-06-12 MED ORDER — OXYCODONE-ACETAMINOPHEN 5-325 MG PO TABS
ORAL_TABLET | ORAL | Status: AC
  Administered 2011-06-12: 2 via ORAL
  Filled 2011-06-12: qty 2

## 2011-06-12 MED ORDER — HYDROMORPHONE HCL PF 1 MG/ML IJ SOLN
0.2500 mg | INTRAMUSCULAR | Status: DC | PRN
  Administered 2011-06-12: 0.5 mg via INTRAVENOUS
  Administered 2011-06-12 (×2): 0.25 mg via INTRAVENOUS
  Administered 2011-06-12 (×2): 0.5 mg via INTRAVENOUS

## 2011-06-12 MED ORDER — PROPOFOL 10 MG/ML IV EMUL
INTRAVENOUS | Status: DC | PRN
  Administered 2011-06-12: 200 mL via INTRAVENOUS

## 2011-06-12 MED ORDER — ONDANSETRON HCL 4 MG/2ML IJ SOLN: 4.0000 mg | Freq: Once | INTRAMUSCULAR | Status: DC | PRN

## 2011-06-12 MED ORDER — SODIUM CHLORIDE 0.9 % IR SOLN
Status: DC | PRN
  Administered 2011-06-12: 1000 mL

## 2011-06-12 MED ORDER — MEPERIDINE HCL 25 MG/ML IJ SOLN: 6.2500 mg | INTRAMUSCULAR | Status: DC | PRN

## 2011-06-12 MED ORDER — PHENYLEPHRINE HCL 10 MG/ML IJ SOLN
INTRAMUSCULAR | Status: DC | PRN
  Administered 2011-06-12: 80 ug via INTRAVENOUS
  Administered 2011-06-12 (×2): 40 ug via INTRAVENOUS
  Administered 2011-06-12: 80 ug via INTRAVENOUS
  Administered 2011-06-12: 40 ug via INTRAVENOUS
  Administered 2011-06-12: 80 ug via INTRAVENOUS
  Administered 2011-06-12: 120 ug via INTRAVENOUS

## 2011-06-12 MED ORDER — MIDAZOLAM HCL 5 MG/5ML IJ SOLN
INTRAMUSCULAR | Status: DC | PRN
  Administered 2011-06-12: 2 mg via INTRAVENOUS

## 2011-06-12 MED ORDER — SODIUM CHLORIDE 0.9 % IV SOLN
INTRAVENOUS | Status: DC | PRN
  Administered 2011-06-12: 14:00:00 via INTRAVENOUS

## 2011-06-12 MED ORDER — MUPIROCIN 2 % EX OINT
TOPICAL_OINTMENT | CUTANEOUS | Status: AC
  Administered 2011-06-12: 1 via NASAL
  Filled 2011-06-12: qty 22

## 2011-06-12 MED ORDER — ONDANSETRON HCL 4 MG/2ML IJ SOLN
INTRAMUSCULAR | Status: DC | PRN
  Administered 2011-06-12: 4 mg via INTRAVENOUS

## 2011-06-12 MED ORDER — HYDROMORPHONE HCL PF 1 MG/ML IJ SOLN
INTRAMUSCULAR | Status: AC
  Filled 2011-06-12: qty 1

## 2011-06-12 SURGICAL SUPPLY — 38 items
ADH SKN CLS APL DERMABOND .7 (GAUZE/BANDAGES/DRESSINGS) ×1
BANDAGE ACE 4 STERILE (GAUZE/BANDAGES/DRESSINGS) ×1 IMPLANT
CANISTER SUCTION 2500CC (MISCELLANEOUS) ×2 IMPLANT
CLIP TI MEDIUM 6 (CLIP) ×2 IMPLANT
CLIP TI WIDE RED SMALL 6 (CLIP) ×2 IMPLANT
CLOTH BEACON ORANGE TIMEOUT ST (SAFETY) ×2 IMPLANT
CONT SPECI 4OZ STER CLIK (MISCELLANEOUS) ×2 IMPLANT
COVER PROBE W GEL 5X96 (DRAPES) ×1 IMPLANT
COVER SURGICAL LIGHT HANDLE (MISCELLANEOUS) ×4 IMPLANT
DERMABOND ADVANCED (GAUZE/BANDAGES/DRESSINGS) ×1
DERMABOND ADVANCED .7 DNX12 (GAUZE/BANDAGES/DRESSINGS) ×1 IMPLANT
DRSG COVADERM 4X6 (GAUZE/BANDAGES/DRESSINGS) ×1 IMPLANT
DRSG COVADERM 4X8 (GAUZE/BANDAGES/DRESSINGS) ×1 IMPLANT
ELECT REM PT RETURN 9FT ADLT (ELECTROSURGICAL) ×2
ELECTRODE REM PT RTRN 9FT ADLT (ELECTROSURGICAL) ×1 IMPLANT
GLOVE BIOGEL PI IND STRL 6.5 (GLOVE) IMPLANT
GLOVE BIOGEL PI IND STRL 7.5 (GLOVE) ×1 IMPLANT
GLOVE BIOGEL PI INDICATOR 6.5 (GLOVE) ×2
GLOVE BIOGEL PI INDICATOR 7.5 (GLOVE) ×2
GLOVE ECLIPSE 6.5 STRL STRAW (GLOVE) ×1 IMPLANT
GLOVE SURG SS PI 7.5 STRL IVOR (GLOVE) ×2 IMPLANT
GOWN PREVENTION PLUS XXLARGE (GOWN DISPOSABLE) ×2 IMPLANT
GOWN STRL NON-REIN LRG LVL3 (GOWN DISPOSABLE) ×4 IMPLANT
HEMOSTAT SNOW SURGICEL 2X4 (HEMOSTASIS) IMPLANT
HEMOSTAT SURGICEL 2X14 (HEMOSTASIS) IMPLANT
KIT BASIN OR (CUSTOM PROCEDURE TRAY) ×2 IMPLANT
KIT ROOM TURNOVER OR (KITS) ×2 IMPLANT
NS IRRIG 1000ML POUR BTL (IV SOLUTION) ×2 IMPLANT
PACK CV ACCESS (CUSTOM PROCEDURE TRAY) ×2 IMPLANT
PAD ARMBOARD 7.5X6 YLW CONV (MISCELLANEOUS) ×4 IMPLANT
SUT PROLENE 6 0 CC (SUTURE) ×2 IMPLANT
SUT VIC AB 3-0 SH 27 (SUTURE) ×4
SUT VIC AB 3-0 SH 27X BRD (SUTURE) ×1 IMPLANT
SUT VICRYL 4-0 PS2 18IN ABS (SUTURE) IMPLANT
TOWEL OR 17X24 6PK STRL BLUE (TOWEL DISPOSABLE) ×2 IMPLANT
TOWEL OR 17X26 10 PK STRL BLUE (TOWEL DISPOSABLE) ×2 IMPLANT
UNDERPAD 30X30 INCONTINENT (UNDERPADS AND DIAPERS) ×2 IMPLANT
WATER STERILE IRR 1000ML POUR (IV SOLUTION) ×2 IMPLANT

## 2011-06-12 NOTE — Preoperative (Signed)
Beta Blockers   Reason not to administer Beta Blockers:Not Applicable 

## 2011-06-12 NOTE — Anesthesia Preprocedure Evaluation (Signed)
Anesthesia Evaluation  Patient identified by MRN, date of birth, ID band Patient awake    Reviewed: Allergy & Precautions, H&P , NPO status , Patient's Chart, lab work & pertinent test results  History of Anesthesia Complications (+) PONV  Airway Mallampati: I TM Distance: >3 FB Neck ROM: Full    Dental  (+) Teeth Intact and Dental Advisory Given   Pulmonary  clear to auscultation        Cardiovascular Regular Normal    Neuro/Psych    GI/Hepatic   Endo/Other    Renal/GU      Musculoskeletal   Abdominal   Peds  Hematology   Anesthesia Other Findings   Reproductive/Obstetrics                           Anesthesia Physical Anesthesia Plan  ASA: III  Anesthesia Plan: General   Post-op Pain Management:    Induction: Intravenous  Airway Management Planned: LMA  Additional Equipment:   Intra-op Plan:   Post-operative Plan: Extubation in OR  Informed Consent: I have reviewed the patients History and Physical, chart, labs and discussed the procedure including the risks, benefits and alternatives for the proposed anesthesia with the patient or authorized representative who has indicated his/her understanding and acceptance.   Dental advisory given  Plan Discussed with: CRNA, Anesthesiologist and Surgeon  Anesthesia Plan Comments:         Anesthesia Quick Evaluation

## 2011-06-12 NOTE — Anesthesia Postprocedure Evaluation (Signed)
  Anesthesia Post-op Note  Patient: Logan [name]  Procedure(s) Performed: Procedure(s) (LRB): REVISON OF ARTERIOVENOUS FISTULA (Left)  Patient Location: PACU  Anesthesia Type: General  Level of Consciousness: awake, alert  and oriented  Airway and Oxygen Therapy: Patient Spontanous Breathing  Post-op Pain: moderate  Post-op Assessment: Post-op Vital signs reviewed, Patient's Cardiovascular Status Stable, Respiratory Function Stable, Patent Airway, No signs of Nausea or vomiting and Pain level controlled  Post-op Vital Signs: Reviewed and stable  Complications: No apparent anesthesia complications

## 2011-06-12 NOTE — Interval H&P Note (Signed)
History and Physical Interval Note:  06/12/2011 1:38 PM  Logan [name]  has presented today for surgery, with the diagnosis of ESRD,COMPLICATION WITH AVF  The various methods of treatment have been discussed with the patient and family. After consideration of risks, benefits and other options for treatment, the patient has consented to  Procedure(s) (LRB): REVISON OF ARTERIOVENOUS FISTULA (Left) as a surgical intervention .  The patients' history has been reviewed, patient examined, no change in status, stable for surgery.  I have reviewed the patients' chart and labs.  Questions were answered to the patient's satisfaction.     Omarian Jaquith IV, V. WELLS

## 2011-06-12 NOTE — Op Note (Signed)
Vascular and Vein Specialists of [facility]  Patient name: Logan [name] MRN: [id number] DOB: Nov 20, 1970 Sex: male  06/12/2011 Pre-operative Diagnosis: Infected left radiocephalic fistula Post-operative diagnosis:  Same Surgeon:  Eldridge Abrahams Assistants:  None Procedure:   Resection of left radiocephalic fistula aneurysm Anesthesia:  Gen. Blood Loss:  See anesthesia record Specimens:  Portion of the aneurysm was sent for pathology and for culture  Findings:  Thrombosed cephalic vein fistula. No gross purulence was detected  Indications:  This is a patient with end-stage renal disease who dialyzes through a left upper arm AV fistula. He has been complaining of pain and redness surrounding A. aneurysmal segment of his left radiocephalic fistula which has occluded. He comes in today for resection  Procedure:  The patient was identified in the holding area and taken to [place]  The patient was then placed supine on the table. general anesthesia was administered.  The patient was prepped and draped in the usual sterile fashion.  A time out was called and antibiotics were administered.  A longitudinal incision was made over the cephalic vein and the aneurysmal segment. A combination of Bovie and cautery dissection were used to circumferentially isolate the vein. I ligated the proximal and distal end of the canthal portion of the vein with 2-0 silk ties. I then completed with transection and removal of the aneurysm which was thrombosed. Aneurysm was sent for culture and pathology. The wound was irrigated and hemostasis was achieved. The subcutaneous tissue was closed with 3-0 Vicryl the skin was closed with 3-0 nylon and a sterile dressing was applied followed by an Ace wrap.   Disposition:  To PACU in stable condition.   Theotis Burrow, M.D. [facility] [place] Office: [phone] Pager:  [phone]

## 2011-06-12 NOTE — Progress Notes (Signed)
Reguarding health history, pt. Denies htn,hep. C,and hypoparathyroidism. States the only meds he takes is tums.

## 2011-06-12 NOTE — Progress Notes (Signed)
Orthopedic Tech Progress Note Patient Details:  Logan [name] 08/01/1970 [id number]  Other Ortho Devices Type of Ortho Device: Other (comment) (arm sling) Ortho Device Location: (L) MUE Ortho Device Interventions: Application   Braulio Bosch 06/12/2011, 6:40 PM

## 2011-06-12 NOTE — Transfer of Care (Signed)
Immediate Anesthesia Transfer of Care Note  Patient: Logan [name]  Procedure(s) Performed: Procedure(s) (LRB): REVISON OF ARTERIOVENOUS FISTULA (Left)  Patient Location: PACU  Anesthesia Type: General  Level of Consciousness: awake  Airway & Oxygen Therapy: Patient Spontanous Breathing and Patient connected to nasal cannula oxygen  Post-op Assessment: Report given to PACU RN and Post -op Vital signs reviewed and stable  Post vital signs: stable  Complications: No apparent anesthesia complications

## 2011-06-12 NOTE — H&P (View-Only) (Signed)
Vascular and Vein Specialist of [facility]   Patient name: Logan [name] MRN: [id number] DOB: November 06, 1970 Sex: male     Chief Complaint  Patient presents with  . 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 He currently dialyzes through a left brachiocephalic fistula. He claims that there is infection within his fistula. He denies having any fevers or drainage.  Past Medical History  Diagnosis Date  . Dialysis patient     MONDAY, WEDNESDAY, [place]  . Anemia   . Chronic kidney disease   . History of hypoparathyroidism   . Hypertension   . History of hepatitis C   . End stage renal disease     Past Surgical History  Procedure Date  . Av fistula placement, brachiocephalic [id number]    Left arm by Dr. Donnetta Hutching    History   Social History  . Marital Status: Married    Spouse Name: N/A    Number of Children: N/A  . Years of Education: N/A   Occupational History  . Not on file.   Social History Main Topics  . Smoking status: Former Smoker -- 1.0 packs/day for 3 years    Quit date: 11/19/1995  . Smokeless tobacco: Not on file  . Alcohol Use: No  . Drug Use: No  . Sexually Active: Not on file   Other Topics Concern  . Not on file   Social History Narrative  . No narrative on file    No family history on file.  Allergies as of 06/09/2011  . (No Known Allergies)    Current Outpatient Prescriptions on File Prior to Visit  Medication Sig Dispense Refill  . calcium carbonate (TUMS - DOSED IN MG ELEMENTAL CALCIUM) 500 MG chewable tablet Chew 1 tablet by mouth daily.        [name] docusate sodium (COLACE) 100 MG capsule Take 100 mg by mouth 2 (two) times daily.        [name] ethyl chloride spray Apply topically as needed.        . folic acid-vitamin b complex-vitamin  c-selenium-zinc (DIALYVITE) 3 MG TABS Take 1 tablet by mouth daily.        . cinacalcet (SENSIPAR) 90 MG tablet Take 90 mg by mouth daily.        . hydrOXYzine (ATARAX) 25 MG tablet Take 25 mg by mouth 3 (three) times daily as needed.        [name] lanthanum (FOSRENOL) 1000 MG chewable tablet Chew 1,000 mg by mouth 2 (two) times daily with a meal.        . lisinopril (PRINIVIL,ZESTRIL) 10 MG tablet Take 10 mg by mouth daily.           REVIEW OF SYSTEMS: No chest pain or shortness of breath no fevers chills. Positive for left wrist pain over his fistula. The patient is Spanish-speaking and communication is somewhat limited   PHYSICAL EXAMINATION:   Vital signs are BP 106/65  Pulse 79  Temp(Src) 98.1 F (36.7 C) (Oral)  Ht 5\' 3"  (1.6 m)  Wt 170 lb (77.111 kg)  BMI 30.11 kg/m2 General: The patient appears their stated age. HEENT:  No gross abnormalities Pulmonary:  Non labored breathing Musculoskeletal: There are no major deformities. Neurologic: No focal weakness or  paresthesias are detected, Skin: There are no ulcer or rashes noted. Psychiatric: The patient has normal affect. Cardiovascular: There is a functioning left brachiocephalic fistula. The left radiocephalic fistula has an aneurysm which is raised and red. There are no skin defects. 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 I discussed making a longitudinal incision of the aneurysm and resecting the involved portion. As we done this coming Friday it is a dialysis patient will have to rearrange his schedule.  Eldridge Abrahams, M.D. [facility] Vein Specialists of [place] Office: [phone] Pager:  [phone]

## 2011-06-12 NOTE — Discharge Instructions (Signed)
Instrucciones a seguir luego de la anestesia general en los adultos (Instructions Following General Anesthetic, Adult) Usted fue sometido a anestesia general. Un anestesista (un enfermero especializado en administrar anestesia) o un anestesilogo (un mdico especializado en administrar anestesia) lo ha inducido a dormir con Engineer, mining un procedimiento. Durante las 24 horas siguientes al procedimiento podr sentir:  Tree surgeon.   Debilidad.   Somnolencia.  DESPUS DE LA CIRUGA Despus de la [place], lo llevarn a una sala de recuperacin. Un enfermero controlar su evolucin. Una vez que despierte, se encuentre estabilizado y pueda ingerir lquidos, usted podr volver a su hogar excepto que ocurra un imprevisto. La informacin que sigue es vlida para las primeras 24 horas posteriores a Architect.  No conduzca. Si est solo, no tome transportes pblicos.   No beba alcohol.   No tome medicamentos que no le haya autorizado el profesional que lo asiste.   No firme documentos importantes ni tome decisiones trascendentes.   Es importante que una persona responsable lo acompae durante las primeras 24 horas luego de la anestesia.   Puede reanudar su dieta y sus actividades normales segn se le haya indicado.   Utilice los medicamentos de venta libre o de prescripcin para Conservation officer, historic buildings, Health and safety inspector o la [place], segn se lo indique el profesional que lo asiste.  Si tiene preguntas o se le presenta algn problema relacionado con la anestesia, comunquese con el hospital y pida por el anestesista o anestesilogo de [place]. SOLICITE ATENCIN MDICA DE INMEDIATO SI:  Aparece una erupcin cutnea.   Presenta dificultad para respirar.   Siente dolor en el pecho.   Presenta algn problema de alergia.  Document Released: 03/31/2005 Document Revised: 12/11/2010 [facility] Patient Information 2012 [place].

## 2011-06-13 LAB — POCT I-STAT 4, (NA,K, GLUC, HGB,HCT)
Glucose, Bld: 105 mg/dL — ABNORMAL HIGH (ref 70–99)
HCT: 49 % (ref 39.0–52.0)
Potassium: 4.7 mEq/L (ref 3.5–5.1)
Sodium: 137 mEq/L (ref 135–145)

## 2011-06-15 LAB — TISSUE CULTURE

## 2011-06-17 LAB — ANAEROBIC CULTURE

## 2011-06-30 ENCOUNTER — Ambulatory Visit: Payer: Medicaid Other | Admitting: Surgery

## 2011-07-01 ENCOUNTER — Encounter: Payer: Self-pay | Admitting: Thoracic Diseases

## 2011-07-02 ENCOUNTER — Ambulatory Visit (INDEPENDENT_AMBULATORY_CARE_PROVIDER_SITE_OTHER): Payer: Self-pay | Admitting: Neurosurgery

## 2011-07-02 ENCOUNTER — Encounter: Payer: Self-pay | Admitting: Neurosurgery

## 2011-07-02 ENCOUNTER — Ambulatory Visit: Payer: Self-pay

## 2011-07-02 VITALS — BP 101/72 | HR 97 | Resp 18 | Ht 61.0 in | Wt 169.0 lb

## 2011-07-02 DIAGNOSIS — I120 Hypertensive chronic kidney disease with stage 5 chronic kidney disease or end stage renal disease: Secondary | ICD-10-CM | POA: Insufficient documentation

## 2011-07-02 DIAGNOSIS — N186 End stage renal disease: Secondary | ICD-10-CM

## 2011-07-02 NOTE — Progress Notes (Signed)
VASCULAR & VEIN SPECIALISTS OF [place] HISTORY AND PHYSICAL   History of Present Illness: This a 41 year old patient of Dr. Trula Slade that seen in followup for suture removal status post and aneurysmal resection left forearm 2 weeks ago. The patient reports through his interpreter that he's had no pain and no difficulty since his revision.  Past Medical History  Diagnosis Date  . Dialysis patient     MONDAY, WEDNESDAY, [place]  . Anemia   . Chronic kidney disease   . History of hypoparathyroidism   . Hypertension   . History of hepatitis C   . End stage renal disease   . Complication of anesthesia   . PONV (postoperative nausea and vomiting)     ROS: [x]  Positive   [ ]  Denies    General: [ ]  Weight loss, [ ]  Fever, [ ]  chills Neurologic: [ ]  Dizziness, [ ]  Blackouts, [ ]  Seizure [ ]  Stroke, [ ]  "Mini stroke", [ ]  Slurred speech, [ ]  Temporary blindness; [ ]  weakness in arms or legs, [ ]  Hoarseness Cardiac: [ ]  Chest pain/pressure, [ ]  Shortness of breath at rest [ ]  Shortness of breath with exertion, [ ]  Atrial fibrillation or irregular heartbeat Vascular: [ ]  Pain in legs with walking, [ ]  Pain in legs at rest, [ ]  Pain in legs at night,  [ ]  Non-healing ulcer, [ ]  Blood clot in vein/DVT,   Pulmonary: [ ]  Home oxygen, [ ]  Productive cough, [ ]  Coughing up blood, [ ]  Asthma,  [ ]  Wheezing Musculoskeletal:  [ ]  Arthritis, [ ]  Low back pain, [ ]  Joint pain Hematologic: [ ]  Easy Bruising, [ ]  Anemia; [ ]  Hepatitis Gastrointestinal: [ ]  Blood in stool, [ ]  Gastroesophageal Reflux/heartburn, [ ]  Trouble swallowing Urinary: [ ]  chronic Kidney disease, [ ]  on HD - [ ]  MWF or [ ]  TTHS, [ ]  Burning with urination, [ ]  Difficulty urinating Skin: [ ]  Rashes, [ ]  Wounds Psychological: [ ]  Anxiety, [ ]  Depression   Social History History  Substance Use Topics  . Smoking status: Former Smoker -- 1.0 packs/day for 3 years    Types: Cigarettes    Quit date: 11/19/1995  . Smokeless tobacco:  Never Used  . Alcohol Use: Yes     occasionally beer    Family History No family history on file.  No Known Allergies  Current Outpatient Prescriptions  Medication Sig Dispense Refill  . calcium carbonate (TUMS - DOSED IN MG ELEMENTAL CALCIUM) 500 MG chewable tablet Chew 5 tablets by mouth 3 (three) times daily with meals as needed. indigestion        Physical Examination  Filed Vitals:   07/02/11 1548  BP: 101/72  Pulse: 97  Resp: 18    Body mass index is 31.93 kg/(m^2).  General:  WDWN in NAD Gait: Normal HENT: WNL Eyes: Pupils equal Pulmonary: normal non-labored breathing , without Rales, rhonchi,  wheezing Cardiac: RRR, without  Murmurs, rubs or gallops; No carotid bruits Abdomen: soft, NT, no masses Skin: no rashes, ulcers noted Vascular Exam/Pulses: Bilateral radial pulses are present and palpable  Extremities without ischemic changes, no Gangrene , no cellulitis; no open wounds other than his surgical wound.  Musculoskeletal: no muscle wasting or atrophy  Neurologic: A&O X 3; Appropriate Affect ; SENSATION: normal; MOTOR FUNCTION:  moving all extremities equally. Speech is fluent/normal  Non-Invasive Vascular Imaging: No imaging today  ASSESSMENT: Again, 41 year old patient of Dr. Stephens Shire in for suture removal of left forearm  status post a aneurysmal resection. The patient appears to be healed well we did remove the sutures today however there was quite a bit of dried blood which required a more invasive approach to remove the sutures however there is no bleeding and we did apply Steri-Strips and he was given instructions regarding this. PLAN: The patient will followup with Dr. Trula Slade in the next one to 2 weeks for a wound check. He will continue his left upper extremity AV fistula for dialysis. Questions were encouraged and answered to his interpreter, no prescriptions were given, Dr. Trula Slade will see him back as scheduled

## 2011-07-04 ENCOUNTER — Encounter: Payer: Self-pay | Admitting: Surgery

## 2011-07-07 ENCOUNTER — Ambulatory Visit (INDEPENDENT_AMBULATORY_CARE_PROVIDER_SITE_OTHER): Payer: Self-pay | Admitting: Surgery

## 2011-07-07 ENCOUNTER — Encounter: Payer: Self-pay | Admitting: Surgery

## 2011-07-07 VITALS — BP 100/76 | HR 69 | Temp 98.2°F | Ht 63.0 in | Wt 175.0 lb

## 2011-07-07 DIAGNOSIS — N186 End stage renal disease: Secondary | ICD-10-CM

## 2011-07-07 NOTE — Progress Notes (Signed)
The patient comes back today for a wound check. He is status post resection of a left radiocephalic fistula aneurysm. His radiocephalic fistula had thrombosed. He is currently dialyzing through a left upper arm fistula. There were difficulty removing some of the sutures, and therefore he comes back for a wound check. His procedure was on 06/12/2011.  On examination the wound is healing nicely. There is no drainage. There is no evidence of infection.  The patient will followup on a when necessary basis

## 2013-06-20 ENCOUNTER — Encounter: Payer: Self-pay | Admitting: Vascular Surgery

## 2013-06-21 ENCOUNTER — Encounter: Payer: Self-pay | Admitting: Vascular Surgery

## 2013-06-21 ENCOUNTER — Ambulatory Visit (INDEPENDENT_AMBULATORY_CARE_PROVIDER_SITE_OTHER): Payer: Self-pay | Admitting: Vascular Surgery

## 2013-06-21 VITALS — BP 103/71 | HR 85 | Ht 63.0 in | Wt 173.4 lb

## 2013-06-21 DIAGNOSIS — N186 End stage renal disease: Secondary | ICD-10-CM

## 2013-06-21 NOTE — Progress Notes (Signed)
Here today for evaluation of his left upper arm AV fistula with aneurysmal degeneration. He is well known to our service. He undergone prior thrombosed and infected cephalic vein fistula and had debridement of this and removal with Dr. Trula Slade 2 years ago. He now is being dialyzed via a left upper arm AV fistula. He apparently has had some difficulty with this regarding slow at dialysis. He underwent a arteriogram of this at the outpatient access center. Another pictures but the report says no evidence of any technical difficulties and no evidence of any stenosis. The patient has essentially no ability to speak English before she does have a Optometrist here today. He denies any recent difficulty with dialysis flow reports normal dialysis yesterday. I'm not sure how accurate this assessment is.  Past Medical History  Diagnosis Date  . Dialysis patient     MONDAY, WEDNESDAY, [place]  . Anemia   . Chronic kidney disease   . History of hypoparathyroidism   . Hypertension   . History of hepatitis C   . End stage renal disease   . Complication of anesthesia   . PONV (postoperative nausea and vomiting)     History  Substance Use Topics  . Smoking status: Former Smoker -- 1.00 packs/day for 3 years    Types: Cigarettes    Quit date: 11/19/1995  . Smokeless tobacco: Never Used  . Alcohol Use: Yes     Comment: occasionally beer    History reviewed. No pertinent family history.  No Known Allergies  Current outpatient prescriptions:calcium carbonate (TUMS - DOSED IN MG ELEMENTAL CALCIUM) 500 MG chewable tablet, Chew 5 tablets by mouth 3 (three) times daily with meals as needed. indigestion, Disp: , Rfl:   BP 103/71  Pulse 85  Ht 5\' 3"  (1.6 m)  Wt 173 lb 6.4 oz (78.654 kg)  BMI 30.72 kg/m2  SpO2 99%  Body mass index is 30.72 kg/(m^2).       Physical exam well-developed gentleman no acute distress. He is a palpable radial pulse he does have a scar on his medial forearm from his prior  resection of a thrombosed infected aneurysm. The upper arm fistula has a very large dilatation from the anastomosis although we up into his shoulder. Does have 2 discrete aneurysmal changes in the midportion of the fistula. There is no skin breakdown and no evidence of infection.  Impression and plan large fistula with aneurysmal change but no evidence of infection or skin breakdown. I had explained to the interpreter that it would be appropriate to continue to use this. He would require essential the resection of the entire fistula with the prosthetic graft placement as the only option for revision of this. Certainly would reserve this if he is having adequate fistula use. We will be available if he is having an adequate use of his fistula for new left upper arm graft versus attempts at right arm fistula

## 2013-12-18 ENCOUNTER — Emergency Department (HOSPITAL_COMMUNITY): Payer: No Typology Code available for payment source

## 2013-12-18 ENCOUNTER — Emergency Department (HOSPITAL_COMMUNITY)
Admission: EM | Admit: 2013-12-18 | Discharge: 2013-12-18 | Disposition: A | Discharge status: Home / Self Care | Payer: Self-pay | Attending: Emergency Medicine | Admitting: Emergency Medicine

## 2013-12-18 ENCOUNTER — Encounter (HOSPITAL_COMMUNITY): Payer: Self-pay | Admitting: Emergency Medicine

## 2013-12-18 DIAGNOSIS — A420 Pulmonary actinomycosis: Secondary | ICD-10-CM | POA: Insufficient documentation

## 2013-12-18 DIAGNOSIS — Z992 Dependence on renal dialysis: Secondary | ICD-10-CM | POA: Insufficient documentation

## 2013-12-18 DIAGNOSIS — Y92410 Unspecified street and highway as the place of occurrence of the external cause: Secondary | ICD-10-CM | POA: Insufficient documentation

## 2013-12-18 DIAGNOSIS — Z862 Personal history of diseases of the blood and blood-forming organs and certain disorders involving the immune mechanism: Secondary | ICD-10-CM | POA: Insufficient documentation

## 2013-12-18 DIAGNOSIS — N186 End stage renal disease: Secondary | ICD-10-CM | POA: Insufficient documentation

## 2013-12-18 DIAGNOSIS — S59909A Unspecified injury of unspecified elbow, initial encounter: Secondary | ICD-10-CM | POA: Insufficient documentation

## 2013-12-18 DIAGNOSIS — S59919A Unspecified injury of unspecified forearm, initial encounter: Secondary | ICD-10-CM

## 2013-12-18 DIAGNOSIS — Z87891 Personal history of nicotine dependence: Secondary | ICD-10-CM | POA: Insufficient documentation

## 2013-12-18 DIAGNOSIS — S6990XA Unspecified injury of unspecified wrist, hand and finger(s), initial encounter: Secondary | ICD-10-CM

## 2013-12-18 DIAGNOSIS — Z8639 Personal history of other endocrine, nutritional and metabolic disease: Secondary | ICD-10-CM | POA: Insufficient documentation

## 2013-12-18 DIAGNOSIS — Z8619 Personal history of other infectious and parasitic diseases: Secondary | ICD-10-CM | POA: Insufficient documentation

## 2013-12-18 DIAGNOSIS — Z23 Encounter for immunization: Secondary | ICD-10-CM | POA: Insufficient documentation

## 2013-12-18 DIAGNOSIS — Y9389 Activity, other specified: Secondary | ICD-10-CM | POA: Insufficient documentation

## 2013-12-18 DIAGNOSIS — IMO0002 Reserved for concepts with insufficient information to code with codable children: Secondary | ICD-10-CM | POA: Insufficient documentation

## 2013-12-18 DIAGNOSIS — S60811A Abrasion of right wrist, initial encounter: Secondary | ICD-10-CM

## 2013-12-18 MED ORDER — TETANUS-DIPHTH-ACELL PERTUSSIS 5-2.5-18.5 LF-MCG/0.5 IM SUSP
0.5000 mL | Freq: Once | INTRAMUSCULAR | Status: AC
  Administered 2013-12-18: 0.5 mL via INTRAMUSCULAR
  Filled 2013-12-18: qty 0.5

## 2013-12-18 MED ORDER — METHOCARBAMOL 500 MG PO TABS: 500.0000 mg | ORAL_TABLET | Freq: Two times a day (BID) | ORAL | Status: DC | PRN
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
## 2013-12-18 NOTE — ED Notes (Signed)
Also c/o left elbow pain

## 2013-12-18 NOTE — ED Notes (Signed)
Pt has diaylisis Mon wed and Friday

## 2013-12-18 NOTE — ED Notes (Signed)
Patient transported to X-ray 
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
## 2013-12-19 NOTE — ED Provider Notes (Signed)
Medical screening examination/treatment/procedure(s) were performed by non-physician practitioner and as supervising physician I was immediately available for consultation/collaboration.   EKG Interpretation None       Varney Biles, MD 12/19/13 1504

## 2014-03-28 ENCOUNTER — Other Ambulatory Visit: Payer: Self-pay

## 2014-03-28 DIAGNOSIS — T82510A Breakdown (mechanical) of surgically created arteriovenous fistula, initial encounter: Secondary | ICD-10-CM

## 2014-04-28 ENCOUNTER — Encounter: Payer: Self-pay | Admitting: Surgery

## 2014-05-01 ENCOUNTER — Ambulatory Visit: Payer: Self-pay | Admitting: Surgery

## 2014-05-01 ENCOUNTER — Ambulatory Visit (HOSPITAL_COMMUNITY)
Admission: RE | Admit: 2014-05-01 | Discharge: 2014-05-01 | Disposition: A | Discharge status: Home / Self Care | Payer: Self-pay | Source: Ambulatory Visit | Attending: Surgery | Admitting: Surgery

## 2014-05-01 ENCOUNTER — Ambulatory Visit (INDEPENDENT_AMBULATORY_CARE_PROVIDER_SITE_OTHER): Payer: Self-pay | Admitting: Surgery

## 2014-05-01 ENCOUNTER — Encounter: Payer: Self-pay | Admitting: Surgery

## 2014-05-01 ENCOUNTER — Other Ambulatory Visit (HOSPITAL_COMMUNITY): Payer: Self-pay

## 2014-05-01 VITALS — BP 160/106 | HR 71 | Ht 63.0 in | Wt 173.1 lb

## 2014-05-01 DIAGNOSIS — X58XXXA Exposure to other specified factors, initial encounter: Secondary | ICD-10-CM | POA: Insufficient documentation

## 2014-05-01 DIAGNOSIS — N186 End stage renal disease: Secondary | ICD-10-CM

## 2014-05-01 DIAGNOSIS — T82510A Breakdown (mechanical) of surgically created arteriovenous fistula, initial encounter: Secondary | ICD-10-CM | POA: Insufficient documentation

## 2014-05-01 NOTE — Progress Notes (Signed)
Patient name: Logan [name] MRN: [id number] DOB: 13-Apr-1971 Sex: male     Chief Complaint  Patient presents with  . 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 early in 2012.  The patient is not having difficulty with cannulation but notes that he has had a significant enlargement of 2 aneurysms in his left upper arm.  There is no skin breakdown.  Past Medical History  Diagnosis Date  . Dialysis patient     MONDAY, [place]  . Anemia   . Chronic kidney disease   . History of hypoparathyroidism   . Hypertension   . History of hepatitis C   . End stage renal disease   . Complication of anesthesia   . PONV (postoperative nausea and vomiting)     Past Surgical History  Procedure Laterality Date  . Av fistula placement, brachiocephalic  [id number]    Left arm by Dr. Donnetta Hutching    History   Social History  . Marital Status: Married    Spouse Name: N/A    Number of Children: N/A  . Years of Education: N/A   Occupational History  . Not on file.   Social History Main Topics  . Smoking status: Former Smoker -- 1.00 packs/day for 3 years    Types: Cigarettes    Quit date: 11/19/1995  . Smokeless tobacco: Never Used  . Alcohol Use: Yes     Comment: occasionally beer  . Drug Use: No  . Sexual Activity: Not on file   Other Topics Concern  . Not on file   Social History Narrative    History reviewed. No pertinent family history.  Allergies as of 05/01/2014  . (No Known Allergies)    Current Outpatient Prescriptions on File Prior to Visit  Medication Sig Dispense Refill  . calcium carbonate (TUMS - DOSED IN MG ELEMENTAL CALCIUM) 500 MG chewable tablet Chew 2-5 tablets by mouth 3 (three) times daily with meals as needed for indigestion.     . methocarbamol (ROBAXIN) 500 MG tablet Take 1 tablet (500 mg  total) by mouth 2 (two) times daily as needed for muscle spasms (or pain(dolor)). (Patient not taking: Reported on 05/01/2014) 15 tablet 0   No current facility-administered medications on file prior to visit.     REVIEW OF SYSTEMS: Cardiovascular: No chest pain, chest pressure, palpitations, orthopnea, or dyspnea on exertion. No claudication or rest pain,  No history of DVT or phlebitis. Pulmonary: No productive cough, asthma or wheezing. Neurologic: No weakness, paresthesias, aphasia, or amaurosis. No dizziness. Hematologic: No bleeding problems or clotting disorders. Musculoskeletal: No joint pain or joint swelling. Gastrointestinal: No blood in stool or hematemesis Genitourinary: No dysuria or hematuria. Psychiatric:: No history of major depression. Integumentary: No rashes or ulcers. Constitutional: No fever or chills.  PHYSICAL EXAMINATION:   Vital signs are BP 160/106 mmHg  Pulse 71  Ht 5\' 3"  (1.6 m)  Wt 173 lb 1.6 oz (78.518 kg)  BMI 30.67 kg/m2  SpO2 100% General: The patient appears their stated age. HEENT:  No gross abnormalities Pulmonary:  Non labored breathing Musculoskeletal: There are no major deformities. Neurologic: No focal weakness or paresthesias are detected, Skin: There are no ulcer or rashes noted. Psychiatric: The patient has normal affect. Cardiovascular: There is a regular rate and rhythm without significant murmur appreciated.  2 large aneurysms were visualized within the left upper arm.   Diagnostic Studies None  Assessment: Aneurysmal left upper arm AV fistula Plan: Spanish interpreter was present for the exam and assisted with translation.  The patient has 2 large aneurysms in his left upper arm fistula which need repair.  I discussed the details of plication.  He does have a large section of vein proximal and distal to the aneurysms which I feel can be used for dialysis during his recovery time from his surgery.  I did discuss that if he  cannot receive dialysis via cannulation of these areas proximal and distal to the aneurysms that he would need a catheter for 1 month.  I also discussed the possibility of fistula thrombosis.  He is willing to proceed.  His operations been scheduled for Thursday, January 28  V. Leia Alf, M.D. [facility] Office: [phone] Pager:  [phone]

## 2014-05-08 ENCOUNTER — Other Ambulatory Visit: Payer: Self-pay

## 2014-05-10 ENCOUNTER — Encounter (HOSPITAL_COMMUNITY): Payer: Self-pay | Admitting: *Deleted

## 2014-05-10 MED ORDER — DEXTROSE 5 % IV SOLN
1.5000 g | INTRAVENOUS | Status: AC
  Administered 2014-05-11: 1.5 g via INTRAVENOUS

## 2014-05-10 NOTE — Progress Notes (Signed)
Pre-op call done with interpreting via [place], [place]. There has been an interpreter requested for DOS.

## 2014-05-11 ENCOUNTER — Telehealth: Payer: Self-pay | Admitting: Surgery

## 2014-05-11 ENCOUNTER — Other Ambulatory Visit: Payer: Self-pay

## 2014-05-11 ENCOUNTER — Ambulatory Visit (HOSPITAL_COMMUNITY): Payer: Self-pay | Admitting: Critical Care Medicine

## 2014-05-11 ENCOUNTER — Encounter (HOSPITAL_COMMUNITY)
Admission: RE | Disposition: A | Discharge status: Home / Self Care | Payer: Self-pay | Source: Ambulatory Visit | Attending: Surgery

## 2014-05-11 ENCOUNTER — Ambulatory Visit (HOSPITAL_COMMUNITY)
Admission: RE | Admit: 2014-05-11 | Discharge: 2014-05-11 | Disposition: A | Discharge status: Home / Self Care | Payer: Self-pay | Source: Ambulatory Visit | Attending: Surgery | Admitting: Surgery

## 2014-05-11 ENCOUNTER — Encounter (HOSPITAL_COMMUNITY): Payer: Self-pay | Admitting: *Deleted

## 2014-05-11 DIAGNOSIS — Y832 Surgical operation with anastomosis, bypass or graft as the cause of abnormal reaction of the patient, or of later complication, without mention of misadventure at the time of the procedure: Secondary | ICD-10-CM | POA: Insufficient documentation

## 2014-05-11 DIAGNOSIS — Z8619 Personal history of other infectious and parasitic diseases: Secondary | ICD-10-CM | POA: Insufficient documentation

## 2014-05-11 DIAGNOSIS — T82898A Other specified complication of vascular prosthetic devices, implants and grafts, initial encounter: Secondary | ICD-10-CM

## 2014-05-11 DIAGNOSIS — Z992 Dependence on renal dialysis: Secondary | ICD-10-CM | POA: Insufficient documentation

## 2014-05-11 DIAGNOSIS — N186 End stage renal disease: Secondary | ICD-10-CM | POA: Insufficient documentation

## 2014-05-11 DIAGNOSIS — I120 Hypertensive chronic kidney disease with stage 5 chronic kidney disease or end stage renal disease: Secondary | ICD-10-CM | POA: Insufficient documentation

## 2014-05-11 DIAGNOSIS — Z87891 Personal history of nicotine dependence: Secondary | ICD-10-CM | POA: Insufficient documentation

## 2014-05-11 HISTORY — PX: REVISON OF ARTERIOVENOUS FISTULA: SHX6074

## 2014-05-11 HISTORY — DX: Gastro-esophageal reflux disease without esophagitis: K21.9

## 2014-05-11 LAB — POCT I-STAT 4, (NA,K, GLUC, HGB,HCT)
GLUCOSE: 91 mg/dL (ref 70–99)
HCT: 55 % — ABNORMAL HIGH (ref 39.0–52.0)
Hemoglobin: 18.7 g/dL — ABNORMAL HIGH (ref 13.0–17.0)
POTASSIUM: 4.2 mmol/L (ref 3.5–5.1)
SODIUM: 142 mmol/L (ref 135–145)

## 2014-05-11 SURGERY — REVISON OF ARTERIOVENOUS FISTULA
Anesthesia: General | Site: Arm Upper | Laterality: Left

## 2014-05-11 MED ORDER — PHENYLEPHRINE HCL 10 MG/ML IJ SOLN
INTRAMUSCULAR | Status: DC | PRN
  Administered 2014-05-11: 80 ug via INTRAVENOUS
  Administered 2014-05-11: 120 ug via INTRAVENOUS
  Administered 2014-05-11 (×2): 40 ug via INTRAVENOUS
  Administered 2014-05-11: 80 ug via INTRAVENOUS

## 2014-05-11 MED ORDER — CEFUROXIME SODIUM 1.5 G IJ SOLR
INTRAMUSCULAR | Status: AC
  Filled 2014-05-11: qty 1.5

## 2014-05-11 MED ORDER — OXYCODONE-ACETAMINOPHEN 5-325 MG PO TABS: 1.0000 | ORAL_TABLET | Freq: Four times a day (QID) | ORAL | Status: DC | PRN

## 2014-05-11 MED ORDER — PROPOFOL 10 MG/ML IV BOLUS
INTRAVENOUS | Status: AC
  Filled 2014-05-11: qty 20

## 2014-05-11 MED ORDER — LIDOCAINE HCL (CARDIAC) 10 MG/ML IV SOLN
INTRAVENOUS | Status: DC | PRN
  Administered 2014-05-11: 50 mg via INTRAVENOUS

## 2014-05-11 MED ORDER — DEXAMETHASONE SODIUM PHOSPHATE 4 MG/ML IJ SOLN
INTRAMUSCULAR | Status: DC | PRN
  Administered 2014-05-11: 4 mg via INTRAVENOUS

## 2014-05-11 MED ORDER — PHENYLEPHRINE HCL 10 MG/ML IJ SOLN
INTRAMUSCULAR | Status: AC
  Filled 2014-05-11: qty 1

## 2014-05-11 MED ORDER — PROMETHAZINE HCL 25 MG/ML IJ SOLN
6.2500 mg | Freq: Once | INTRAMUSCULAR | Status: AC
  Administered 2014-05-11: 6.25 mg via INTRAVENOUS

## 2014-05-11 MED ORDER — PROMETHAZINE HCL 25 MG/ML IJ SOLN
INTRAMUSCULAR | Status: AC
  Administered 2014-05-11: 6.25 mg via INTRAVENOUS
  Filled 2014-05-11: qty 1

## 2014-05-11 MED ORDER — HYDROMORPHONE HCL 1 MG/ML IJ SOLN
0.2500 mg | INTRAMUSCULAR | Status: DC | PRN
  Administered 2014-05-11: 0.5 mg via INTRAVENOUS

## 2014-05-11 MED ORDER — ONDANSETRON HCL 4 MG/2ML IJ SOLN
INTRAMUSCULAR | Status: DC | PRN
  Administered 2014-05-11: 4 mg via INTRAVENOUS

## 2014-05-11 MED ORDER — PHENYLEPHRINE 40 MCG/ML (10ML) SYRINGE FOR IV PUSH (FOR BLOOD PRESSURE SUPPORT)
PREFILLED_SYRINGE | INTRAVENOUS | Status: AC
  Filled 2014-05-11: qty 10

## 2014-05-11 MED ORDER — ONDANSETRON HCL 4 MG/2ML IJ SOLN
INTRAMUSCULAR | Status: AC
  Filled 2014-05-11: qty 2

## 2014-05-11 MED ORDER — CHLORHEXIDINE GLUCONATE CLOTH 2 % EX PADS: 6.0000 | MEDICATED_PAD | Freq: Once | CUTANEOUS | Status: DC

## 2014-05-11 MED ORDER — PROTAMINE SULFATE 10 MG/ML IV SOLN
INTRAVENOUS | Status: AC
  Filled 2014-05-11: qty 5

## 2014-05-11 MED ORDER — FENTANYL CITRATE 0.05 MG/ML IJ SOLN
INTRAMUSCULAR | Status: AC
  Filled 2014-05-11: qty 5

## 2014-05-11 MED ORDER — MIDAZOLAM HCL 2 MG/2ML IJ SOLN
INTRAMUSCULAR | Status: AC
  Filled 2014-05-11: qty 2

## 2014-05-11 MED ORDER — PROPOFOL 10 MG/ML IV BOLUS
INTRAVENOUS | Status: DC | PRN
  Administered 2014-05-11: 20 mg via INTRAVENOUS
  Administered 2014-05-11: 70 mg via INTRAVENOUS
  Administered 2014-05-11: 130 mg via INTRAVENOUS

## 2014-05-11 MED ORDER — FENTANYL CITRATE 0.05 MG/ML IJ SOLN
INTRAMUSCULAR | Status: DC | PRN
  Administered 2014-05-11 (×3): 25 ug via INTRAVENOUS
  Administered 2014-05-11: 50 ug via INTRAVENOUS
  Administered 2014-05-11 (×5): 25 ug via INTRAVENOUS

## 2014-05-11 MED ORDER — MIDAZOLAM HCL 5 MG/5ML IJ SOLN
INTRAMUSCULAR | Status: DC | PRN
  Administered 2014-05-11: 2 mg via INTRAVENOUS

## 2014-05-11 MED ORDER — HEPARIN SODIUM (PORCINE) 1000 UNIT/ML IJ SOLN
INTRAMUSCULAR | Status: DC | PRN
  Administered 2014-05-11: 4000 [IU] via INTRAVENOUS

## 2014-05-11 MED ORDER — SODIUM CHLORIDE 0.9 % IR SOLN
Status: DC | PRN
  Administered 2014-05-11: 500 mL

## 2014-05-11 MED ORDER — SCOPOLAMINE 1 MG/3DAYS TD PT72
MEDICATED_PATCH | TRANSDERMAL | Status: AC
  Administered 2014-05-11: 1 via TRANSDERMAL
  Filled 2014-05-11: qty 1

## 2014-05-11 MED ORDER — SODIUM CHLORIDE 0.9 % IV SOLN
INTRAVENOUS | Status: DC
Start: 2014-05-11 — End: 2014-05-11
  Administered 2014-05-11 (×2): via INTRAVENOUS

## 2014-05-11 MED ORDER — 0.9 % SODIUM CHLORIDE (POUR BTL) OPTIME
TOPICAL | Status: DC | PRN
  Administered 2014-05-11: 1000 mL

## 2014-05-11 MED ORDER — HYDROMORPHONE HCL 1 MG/ML IJ SOLN
INTRAMUSCULAR | Status: AC
  Filled 2014-05-11: qty 1

## 2014-05-11 MED ORDER — PROTAMINE SULFATE 10 MG/ML IV SOLN
INTRAVENOUS | Status: DC | PRN
  Administered 2014-05-11: 50 mg via INTRAVENOUS

## 2014-05-11 MED ORDER — PHENYLEPHRINE HCL 10 MG/ML IJ SOLN
10.0000 mg | INTRAVENOUS | Status: DC | PRN
  Administered 2014-05-11: 20 ug/min via INTRAVENOUS

## 2014-05-11 SURGICAL SUPPLY — 38 items
BANDAGE ELASTIC 4 VELCRO ST LF (GAUZE/BANDAGES/DRESSINGS) ×2 IMPLANT
BLADE SURG 10 STRL SS (BLADE) ×3 IMPLANT
BNDG GAUZE ELAST 4 BULKY (GAUZE/BANDAGES/DRESSINGS) ×2 IMPLANT
CANISTER SUCTION 2500CC (MISCELLANEOUS) ×3 IMPLANT
CLIP TI MEDIUM 6 (CLIP) ×3 IMPLANT
CLIP TI WIDE RED SMALL 6 (CLIP) ×5 IMPLANT
COVER PROBE W GEL 5X96 (DRAPES) ×1 IMPLANT
COVER SURGICAL LIGHT HANDLE (MISCELLANEOUS) ×3 IMPLANT
ELECT REM PT RETURN 9FT ADLT (ELECTROSURGICAL) ×3
ELECTRODE REM PT RTRN 9FT ADLT (ELECTROSURGICAL) ×1 IMPLANT
GLOVE BIO SURGEON STRL SZ 6.5 (GLOVE) ×2 IMPLANT
GLOVE BIO SURGEONS STRL SZ 6.5 (GLOVE) ×2
GLOVE BIOGEL PI IND STRL 6.5 (GLOVE) IMPLANT
GLOVE BIOGEL PI IND STRL 7.5 (GLOVE) ×1 IMPLANT
GLOVE BIOGEL PI INDICATOR 6.5 (GLOVE) ×2
GLOVE BIOGEL PI INDICATOR 7.5 (GLOVE) ×2
GLOVE SURG SS PI 7.0 STRL IVOR (GLOVE) ×2 IMPLANT
GLOVE SURG SS PI 7.5 STRL IVOR (GLOVE) ×5 IMPLANT
GOWN STRL REUS W/ TWL LRG LVL3 (GOWN DISPOSABLE) ×2 IMPLANT
GOWN STRL REUS W/ TWL XL LVL3 (GOWN DISPOSABLE) ×1 IMPLANT
GOWN STRL REUS W/TWL LRG LVL3 (GOWN DISPOSABLE) ×9
GOWN STRL REUS W/TWL XL LVL3 (GOWN DISPOSABLE) ×3
HEMOSTAT SNOW SURGICEL 2X4 (HEMOSTASIS) IMPLANT
KIT BASIN OR (CUSTOM PROCEDURE TRAY) ×3 IMPLANT
KIT ROOM TURNOVER OR (KITS) ×3 IMPLANT
LIQUID BAND (GAUZE/BANDAGES/DRESSINGS) ×3 IMPLANT
NS IRRIG 1000ML POUR BTL (IV SOLUTION) ×3 IMPLANT
PACK CV ACCESS (CUSTOM PROCEDURE TRAY) ×3 IMPLANT
PAD ARMBOARD 7.5X6 YLW CONV (MISCELLANEOUS) ×6 IMPLANT
PROBE PENCIL 8 MHZ STRL DISP (MISCELLANEOUS) IMPLANT
SPONGE LAP 18X18 X RAY DECT (DISPOSABLE) ×4 IMPLANT
SUT PROLENE 5 0 C 1 36 (SUTURE) ×12 IMPLANT
SUT PROLENE 6 0 CC (SUTURE) ×3 IMPLANT
SUT VIC AB 3-0 SH 27 (SUTURE) ×6
SUT VIC AB 3-0 SH 27X BRD (SUTURE) ×1 IMPLANT
SUT VICRYL 4-0 PS2 18IN ABS (SUTURE) ×2 IMPLANT
UNDERPAD 30X30 INCONTINENT (UNDERPADS AND DIAPERS) ×3 IMPLANT
WATER STERILE IRR 1000ML POUR (IV SOLUTION) ×3 IMPLANT

## 2014-05-11 NOTE — Interval H&P Note (Signed)
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 After consideration of risks, benefits and other options for treatment, the patient has consented to  Procedure(s): REPAIR LEFT ARM FISTULA ANEURYSM X 2 (Left) as a surgical intervention .  The patient's history has been reviewed, patient examined, no change in status, stable for surgery.  I have reviewed the patient's chart and labs.  Questions were answered to the patient's satisfaction.     BRABHAM IV, V. WELLS

## 2014-05-11 NOTE — Transfer of Care (Signed)
Immediate Anesthesia Transfer of Care Note  Patient: Logan [name]  Procedure(s) Performed: Procedure(s): RESECTION AND PLICATION OF ANEURSYM LEFT UPPER ARM FISTULA TIMES TWO (Left)  Patient Location: PACU  Anesthesia Type:General  Level of Consciousness: sedated  Airway & Oxygen Therapy: Patient Spontanous Breathing and Patient connected to nasal cannula oxygen  Post-op Assessment: Report given to RN, Post -op Vital signs reviewed and stable and Patient moving all extremities X 4  Post vital signs: Reviewed and stable  Last Vitals:  Filed Vitals:   05/11/14 1044  BP: 147/103  Pulse: 80  Temp: 37.3 C  Resp: 18    Complications: No apparent anesthesia complications

## 2014-05-11 NOTE — H&P (View-Only) (Signed)
Patient name: Logan [name] MRN: [id number] DOB: October 17, 1970 Sex: male     Chief Complaint  Patient presents with  . 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 early in 2012.  The patient is not having difficulty with cannulation but notes that he has had a significant enlargement of 2 aneurysms in his left upper arm.  There is no skin breakdown.  Past Medical History  Diagnosis Date  . Dialysis patient     MONDAY, WEDNESDAY, [place]  . Anemia   . Chronic kidney disease   . History of hypoparathyroidism   . Hypertension   . History of hepatitis C   . End stage renal disease   . Complication of anesthesia   . PONV (postoperative nausea and vomiting)     Past Surgical History  Procedure Laterality Date  . Av fistula placement, brachiocephalic  [id number]    Left arm by Dr. Donnetta Hutching    History   Social History  . Marital Status: Married    Spouse Name: N/A    Number of Children: N/A  . Years of Education: N/A   Occupational History  . Not on file.   Social History Main Topics  . Smoking status: Former Smoker -- 1.00 packs/day for 3 years    Types: Cigarettes    Quit date: 11/19/1995  . Smokeless tobacco: Never Used  . Alcohol Use: Yes     Comment: occasionally beer  . Drug Use: No  . Sexual Activity: Not on file   Other Topics Concern  . Not on file   Social History Narrative    History reviewed. No pertinent family history.  Allergies as of 05/01/2014  . (No Known Allergies)    Current Outpatient Prescriptions on File Prior to Visit  Medication Sig Dispense Refill  . calcium carbonate (TUMS - DOSED IN MG ELEMENTAL CALCIUM) 500 MG chewable tablet Chew 2-5 tablets by mouth 3 (three) times daily with meals as needed for indigestion.     . methocarbamol (ROBAXIN) 500 MG tablet Take 1 tablet (500 mg  total) by mouth 2 (two) times daily as needed for muscle spasms (or pain(dolor)). (Patient not taking: Reported on 05/01/2014) 15 tablet 0   No current facility-administered medications on file prior to visit.     REVIEW OF SYSTEMS: Cardiovascular: No chest pain, chest pressure, palpitations, orthopnea, or dyspnea on exertion. No claudication or rest pain,  No history of DVT or phlebitis. Pulmonary: No productive cough, asthma or wheezing. Neurologic: No weakness, paresthesias, aphasia, or amaurosis. No dizziness. Hematologic: No bleeding problems or clotting disorders. Musculoskeletal: No joint pain or joint swelling. Gastrointestinal: No blood in stool or hematemesis Genitourinary: No dysuria or hematuria. Psychiatric:: No history of major depression. Integumentary: No rashes or ulcers. Constitutional: No fever or chills.  PHYSICAL EXAMINATION:   Vital signs are BP 160/106 mmHg  Pulse 71  Ht 5\' 3"  (1.6 m)  Wt 173 lb 1.6 oz (78.518 kg)  BMI 30.67 kg/m2  SpO2 100% General: The patient appears their stated age. HEENT:  No gross abnormalities Pulmonary:  Non labored breathing Musculoskeletal: There are no major deformities. Neurologic: No focal weakness or paresthesias are detected, Skin: There are no ulcer or rashes noted. Psychiatric: The patient has normal affect. Cardiovascular: There is a regular rate and rhythm without significant murmur appreciated.  2 large aneurysms were visualized within the left upper arm.   Diagnostic Studies None  Assessment: Aneurysmal left upper arm AV fistula Plan: Spanish interpreter was present for the exam and assisted with translation.  The patient has 2 large aneurysms in his left upper arm fistula which need repair.  I discussed the details of plication.  He does have a large section of vein proximal and distal to the aneurysms which I feel can be used for dialysis during his recovery time from his surgery.  I did discuss that if he  cannot receive dialysis via cannulation of these areas proximal and distal to the aneurysms that he would need a catheter for 1 month.  I also discussed the possibility of fistula thrombosis.  He is willing to proceed.  His operations been scheduled for Thursday, January 28  V. Leia Alf, M.D. [facility] Office: [phone] Pager:  [phone]

## 2014-05-11 NOTE — Progress Notes (Signed)
Spanish interpreter Brayton Layman price was present during preop

## 2014-05-11 NOTE — Discharge Instructions (Signed)

## 2014-05-11 NOTE — Anesthesia Postprocedure Evaluation (Signed)
  Anesthesia Post-op Note  Patient: Logan [name]  Procedure(s) Performed: Procedure(s): RESECTION AND PLICATION OF ANEURSYM LEFT UPPER ARM FISTULA TIMES TWO (Left)  Patient Location: PACU  Anesthesia Type:General  Level of Consciousness: awake and alert   Airway and Oxygen Therapy: Patient Spontanous Breathing  Post-op Pain: none  Post-op Assessment: Post-op Vital signs reviewed, Patient's Cardiovascular Status Stable and Respiratory Function Stable  Post-op Vital Signs: Reviewed  Filed Vitals:   05/11/14 1615  BP:   Pulse: 76  Temp:   Resp: 10    Complications: No apparent anesthesia complications

## 2014-05-11 NOTE — Telephone Encounter (Addendum)
-----   Message from Mena Goes, RN sent at 05/11/2014  2:54 PM EST ----- Regarding: Schedule   ----- Message -----    From: Serafina Mitchell, MD    Sent: 05/11/2014   2:49 PM      To: [facility] Charge Pool  05/11/2014:   Surgeon:  Eldridge Abrahams Assistants:  Silva Bandy Procedure:  #1: Resection of aneurysm 1   #2: Plication of aneurysm 1   Follow-up 2 weeks for wound check     05/11/14: lm for pt re appt, dpm

## 2014-05-11 NOTE — Progress Notes (Signed)
Communicated with patient via interpretor. Wife and son at bedside. Patient very sleepy but was able to verbalize desire and ability to go home. Verified with wife via interpretor that wife was comfortable with patient's level of sleepiness and ability to get him in the house from car. Nephew who is driving patient home able to assist with this. Patient discharged home.

## 2014-05-11 NOTE — Anesthesia Procedure Notes (Signed)
Procedure Name: LMA Insertion Date/Time: 05/11/2014 12:53 PM Performed by: Carola Frost Pre-anesthesia Checklist: Patient identified, Timeout performed, Emergency Drugs available, Suction available and Patient being monitored Patient Re-evaluated:Patient Re-evaluated prior to inductionOxygen Delivery Method: Circle system utilized Preoxygenation: Pre-oxygenation with 100% oxygen Intubation Type: IV induction Ventilation: Mask ventilation without difficulty LMA: LMA inserted LMA Size: 5.0 Number of attempts: 1 Placement Confirmation: positive ETCO2 and breath sounds checked- equal and bilateral Tube secured with: Tape Dental Injury: Teeth and Oropharynx as per pre-operative assessment

## 2014-05-11 NOTE — Op Note (Signed)
    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 Blood Loss:  See anesthesia record Specimens:  None  Findings:  The patient had 2 very large aneurysms.  After mobilization, the fistula was very redundant.  Therefore I resected the more proximal aneurysm and performed a end to end anastomosis.  The proximal aneurysm was plicated, resecting approximately 60% of the aneurysm and then reapproximating the walls.  I also resected redundant skin  Indications:  The patient has 2 very large aneurysms on his upper arm which are getting bigger.  There is skin thickening over top.  He is here for treatment.  Procedure:  The patient was identified in the holding area and taken to [place] [facility]  The patient was then placed supine on the table. general anesthesia was administered.  The patient was prepped and draped in the usual sterile fashion.  A time out was called and antibiotics were administered.   and incision was made anterior to the 2 aneurysms.  Cautery was used to divide the subcutaneous tissue.  With the use of Bovie cautery and sharp dissection I fully skeletonized the 2 aneurysms as well as the fistula proximal and distal to the aneurysm.  Once the aneurysms were fully mobilized, the fistula was very redundant.  I felt that I could plicate the more distal aneurysm, allowing me to resect and perform end-to-end anastomosis of the proximal aneurysm.  An 8 pen was used to mark the fistula to maintain proper orientation.  Heparinization was administered.  After this it circulated the fistula was occluded with vascular clamps.  A #11 blade was used to make a venotomy.  Metzenbaum scissors were used to open the aneurysms  longitudinally.  I initially resected approximately 60% of the anterior wall of the more distal aneurysm.  Once this was done, the vein was reapproximated longitudinally with a running 5-0 Prolene.  Once this was completed, I measured and determined that I could resect in its entirety, the more proximal aneurysm.  This was done and then a end to end anastomosis was performed between the 2 ends of the fistula.  This was with 5-0 Prolene.  Before completion, the fistula was flushed appropriately and the anastomosis was completed.  There remained excellent flow through the fistula.  Several repair stitches were required for hemostasis.  The fistula was without any cankers.  Protamine was then given.  I then resected approximately a 2 cm panel (1 cm on either side) of skin, which was redundant.  I then reapproximated the subcutaneous tissue with a running 3-0 Vicryl.  The skin was closed with a running 4-0 Vicryl followed by Dermabond.  Sterile dressings were applied.  There were no immediate complications.  Disposition:  To PACU in stable condition.   Theotis Burrow, M.D. [facility] Office: [phone] Pager:  [phone]

## 2014-05-11 NOTE — Anesthesia Preprocedure Evaluation (Addendum)
Anesthesia Evaluation  Patient identified by MRN, date of birth, ID band Patient awake    Reviewed: Allergy & Precautions, H&P , NPO status , Patient's Chart, lab work & pertinent test results  History of Anesthesia Complications (+) PONV  Airway Mallampati: II  TM Distance: >3 FB Neck ROM: Full    Dental no notable dental hx. (+) Chipped, Teeth Intact, Dental Advisory Given,    Pulmonary neg pulmonary ROS, former smoker,  breath sounds clear to auscultation  Pulmonary exam normal       Cardiovascular hypertension, Rhythm:Regular Rate:Normal     Neuro/Psych negative neurological ROS  negative psych ROS   GI/Hepatic negative GI ROS, (+) Hepatitis -, C  Endo/Other  negative endocrine ROS  Renal/GU CRF and DialysisRenal disease  negative genitourinary   Musculoskeletal   Abdominal   Peds  Hematology negative hematology ROS (+) anemia ,   Anesthesia Other Findings   Reproductive/Obstetrics negative OB ROS                           Anesthesia Physical Anesthesia Plan  ASA: III  Anesthesia Plan: General   Post-op Pain Management:    Induction: Intravenous  Airway Management Planned: LMA  Additional Equipment:   Intra-op Plan:   Post-operative Plan: Extubation in OR  Informed Consent: I have reviewed the patients History and Physical, chart, labs and discussed the procedure including the risks, benefits and alternatives for the proposed anesthesia with the patient or authorized representative who has indicated his/her understanding and acceptance.   Dental advisory given  Plan Discussed with: CRNA  Anesthesia Plan Comments:         Anesthesia Quick Evaluation

## 2014-05-12 ENCOUNTER — Encounter (HOSPITAL_COMMUNITY): Payer: Self-pay | Admitting: Surgery

## 2014-05-26 ENCOUNTER — Encounter: Payer: Self-pay | Admitting: Surgery

## 2014-05-29 ENCOUNTER — Ambulatory Visit (INDEPENDENT_AMBULATORY_CARE_PROVIDER_SITE_OTHER): Payer: Self-pay | Admitting: Surgery

## 2014-05-29 ENCOUNTER — Encounter: Payer: Self-pay | Admitting: Surgery

## 2014-05-29 VITALS — BP 149/96 | HR 77 | Ht 63.0 in | Wt 176.0 lb

## 2014-05-29 DIAGNOSIS — N186 End stage renal disease: Secondary | ICD-10-CM

## 2014-05-29 NOTE — Progress Notes (Signed)
The patient is here for follow-up.  He is status post resection of left upper arm aneurysm as well as plication of an aneurysm in his fistula on 05/11/2014.  He continues to receive dialysis proximal and distal to his repair.  His incision is nearly completely healed.  There is a good thrill within his fistula.  I told the patient he could begin using the operated portion of his fistula in 2 weeks.  He'll follow-up on an as-needed basis

## 2015-11-08 ENCOUNTER — Emergency Department (HOSPITAL_COMMUNITY): Payer: Self-pay

## 2015-11-08 ENCOUNTER — Encounter (HOSPITAL_COMMUNITY): Payer: Self-pay | Admitting: Emergency Medicine

## 2015-11-08 ENCOUNTER — Emergency Department (HOSPITAL_COMMUNITY)
Admission: EM | Admit: 2015-11-08 | Discharge: 2015-11-08 | Disposition: A | Discharge status: Home / Self Care | Payer: Self-pay | Attending: Emergency Medicine | Admitting: Emergency Medicine

## 2015-11-08 DIAGNOSIS — K8070 Calculus of gallbladder and bile duct without cholecystitis without obstruction: Secondary | ICD-10-CM

## 2015-11-08 DIAGNOSIS — I120 Hypertensive chronic kidney disease with stage 5 chronic kidney disease or end stage renal disease: Secondary | ICD-10-CM | POA: Insufficient documentation

## 2015-11-08 DIAGNOSIS — K8071 Calculus of gallbladder and bile duct without cholecystitis with obstruction: Secondary | ICD-10-CM | POA: Insufficient documentation

## 2015-11-08 DIAGNOSIS — Z87891 Personal history of nicotine dependence: Secondary | ICD-10-CM | POA: Insufficient documentation

## 2015-11-08 DIAGNOSIS — N186 End stage renal disease: Secondary | ICD-10-CM | POA: Insufficient documentation

## 2015-11-08 DIAGNOSIS — Z992 Dependence on renal dialysis: Secondary | ICD-10-CM | POA: Insufficient documentation

## 2015-11-08 LAB — COMPREHENSIVE METABOLIC PANEL
ALK PHOS: 57 U/L (ref 38–126)
ALT: 15 U/L — ABNORMAL LOW (ref 17–63)
ANION GAP: 15 (ref 5–15)
AST: 14 U/L — ABNORMAL LOW (ref 15–41)
Albumin: 4.4 g/dL (ref 3.5–5.0)
BUN: 38 mg/dL — ABNORMAL HIGH (ref 6–20)
CO2: 33 mmol/L — AB (ref 22–32)
Calcium: 9.4 mg/dL (ref 8.9–10.3)
Chloride: 92 mmol/L — ABNORMAL LOW (ref 101–111)
Creatinine, Ser: 12.46 mg/dL — ABNORMAL HIGH (ref 0.61–1.24)
GFR calc non Af Amer: 4 mL/min — ABNORMAL LOW (ref >60)
GFR, EST AFRICAN AMERICAN: 5 mL/min — AB (ref >60)
Glucose, Bld: 157 mg/dL — ABNORMAL HIGH (ref 65–99)
Potassium: 4 mmol/L (ref 3.5–5.1)
SODIUM: 140 mmol/L (ref 135–145)
TOTAL PROTEIN: 9.9 g/dL — AB (ref 6.5–8.1)
Total Bilirubin: 0.9 mg/dL (ref 0.3–1.2)

## 2015-11-08 LAB — CBC
HEMATOCRIT: 54.4 % — AB (ref 39.0–52.0)
HEMOGLOBIN: 18.1 g/dL — AB (ref 13.0–17.0)
MCH: 32.1 pg (ref 26.0–34.0)
MCHC: 33.3 g/dL (ref 30.0–36.0)
MCV: 96.5 fL (ref 78.0–100.0)
Platelets: 156 10*3/uL (ref 150–400)
RBC: 5.64 MIL/uL (ref 4.22–5.81)
RDW: 14.9 % (ref 11.5–15.5)
WBC: 7.4 10*3/uL (ref 4.0–10.5)

## 2015-11-08 LAB — LIPASE, BLOOD: Lipase: 57 U/L — ABNORMAL HIGH (ref 11–51)

## 2015-11-08 MED ORDER — ONDANSETRON 4 MG PO TBDP: 4.0000 mg | ORAL_TABLET | Freq: Three times a day (TID) | ORAL | 0 refills | Status: DC | PRN

## 2015-11-08 MED ORDER — SODIUM CHLORIDE 0.9 % IV BOLUS (SEPSIS)
1000.0000 mL | Freq: Once | INTRAVENOUS | Status: AC
  Administered 2015-11-08: 1000 mL via INTRAVENOUS

## 2015-11-08 MED ORDER — METOCLOPRAMIDE HCL 5 MG/ML IJ SOLN
10.0000 mg | Freq: Once | INTRAMUSCULAR | Status: AC
  Administered 2015-11-08: 10 mg via INTRAVENOUS
  Filled 2015-11-08: qty 2

## 2015-11-08 MED ORDER — IOPAMIDOL (ISOVUE-300) INJECTION 61%
INTRAVENOUS | Status: AC
  Administered 2015-11-08: 100 mL
  Filled 2015-11-08: qty 100

## 2015-11-08 MED ORDER — TRAMADOL HCL 50 MG PO TABS: 50.0000 mg | ORAL_TABLET | Freq: Two times a day (BID) | ORAL | 0 refills | Status: DC | PRN

## 2015-11-08 MED ORDER — ONDANSETRON HCL 4 MG/2ML IJ SOLN
4.0000 mg | Freq: Once | INTRAMUSCULAR | Status: AC
  Administered 2015-11-08: 4 mg via INTRAVENOUS
  Filled 2015-11-08: qty 2

## 2015-11-08 NOTE — ED Provider Notes (Signed)
[facility] DEPT Provider Note   CSN: [id number] Arrival date & time: 11/08/15  [id number]  First Provider Contact:  None       History   Chief Complaint Chief Complaint  Patient presents with  . Abdominal Pain    HPI Logan [name] is a 45 y.o. male past medical history of end-stage renal disease on hemodialysis, hepatitis C, presenting today with abdominal pain. This has been going on for the past 2 days. He has had nausea and vomiting as well. He denies any sick contacts or eating poor foods.  Patient has not had any diarrhea or fevers. Pain is in his central abdomen without radiation. Patient has been compliant with his dialysis sessions. He has not taken anything new for this pain. There are no further complaints.  10 Systems reviewed and are negative for acute change except as noted in the HPI.    HPI  Past Medical History:  Diagnosis Date  . Anemia    pt denies this  . Chronic kidney disease   . Complication of anesthesia   . Dialysis patient ([place])    [place], [place], [place]  . End stage renal disease ([place])   . GERD (gastroesophageal reflux disease)   . History of hepatitis C   . History of hypoparathyroidism    pt denies this  . Hypertension    pt denies this  . PONV (postoperative nausea and vomiting)     Patient Active Problem List   Diagnosis Date Noted  . End stage renal disease ([place]) 07/02/2011    Past Surgical History:  Procedure Laterality Date  . AV FISTULA PLACEMENT, BRACHIOCEPHALIC  [id number]   Left arm by Dr. Donnetta Hutching  . REVISON OF ARTERIOVENOUS FISTULA Left 05/11/2014   Procedure: RESECTION AND PLICATION OF ANEURSYM LEFT UPPER ARM FISTULA TIMES TWO;  Surgeon: Serafina Mitchell, MD;  Location: [facility] OR;  Service: Vascular;  Laterality: Left;       Home Medications    Prior to Admission medications   Medication Sig Start Date End Date Taking? Authorizing Provider  ondansetron (ZOFRAN ODT) 4 MG disintegrating tablet Take 1 tablet (4 mg  total) by mouth every 8 (eight) hours as needed for nausea or vomiting. 11/08/15   Everlene Balls, MD  traMADol (ULTRAM) 50 MG tablet Take 1 tablet (50 mg total) by mouth every 12 (twelve) hours as needed for severe pain. 11/08/15   Everlene Balls, MD    Family History No family history on file.  Social History Social History  Substance Use Topics  . Smoking status: Former Smoker    Packs/day: 1.00    Years: 3.00    Types: Cigarettes    Quit date: 11/19/1995  . Smokeless tobacco: Never Used  . Alcohol use Yes     Comment: occasionally beer     Allergies   Review of patient's allergies indicates no known allergies.   Review of Systems Review of Systems   Physical Exam Updated Vital Signs BP 128/92   Pulse 77   Temp 97.7 F (36.5 C) (Oral)   Resp 16   Wt 168 lb 12.8 oz (76.6 kg)   SpO2 97%   BMI 29.90 kg/m   Physical Exam  Constitutional: He is oriented to person, place, and time. Vital signs are normal. He appears well-developed and well-nourished.  Non-toxic appearance. He does not appear ill. He appears distressed.  Patient actively vomiting on my assessment.  HENT:  Head: Normocephalic and atraumatic.  Nose: Nose normal.  Mouth/Throat:  Oropharynx is clear and moist. No oropharyngeal exudate.  Eyes: Conjunctivae and EOM are normal. Pupils are equal, round, and reactive to light. No scleral icterus.  Neck: Normal range of motion. Neck supple. No tracheal deviation, no edema, no erythema and normal range of motion present. No thyroid mass and no thyromegaly present.  Cardiovascular: Normal rate, regular rhythm, S1 normal, S2 normal, normal heart sounds, intact distal pulses and normal pulses.  Exam reveals no gallop and no friction rub.   No murmur heard. Pulmonary/Chest: Effort normal and breath sounds normal. No respiratory distress. He has no wheezes. He has no rhonchi. He has no rales.  Abdominal: Soft. Normal appearance and bowel sounds are normal. He exhibits no  distension, no ascites and no mass. There is no hepatosplenomegaly. There is no tenderness. There is no rebound, no guarding and no CVA tenderness.  Musculoskeletal: Normal range of motion. He exhibits no edema or tenderness.  Left upper extremity AV fistula palpable with thrill.  Lymphadenopathy:    He has no cervical adenopathy.  Neurological: He is alert and oriented to person, place, and time. He has normal strength. No cranial nerve deficit or sensory deficit.  Skin: Skin is warm, dry and intact. No petechiae and no rash noted. He is not diaphoretic. No erythema. No pallor.  Nursing note and vitals reviewed.    ED Treatments / Results  Labs (all labs ordered are listed, but only abnormal results are displayed) Labs Reviewed  LIPASE, BLOOD - Abnormal; Notable for the following:       Result Value   Lipase 57 (*)    All other components within normal limits  COMPREHENSIVE METABOLIC PANEL - Abnormal; Notable for the following:    Chloride 92 (*)    CO2 33 (*)    Glucose, Bld 157 (*)    BUN 38 (*)    Creatinine, Ser 12.46 (*)    Total Protein 9.9 (*)    AST 14 (*)    ALT 15 (*)    GFR calc non Af Amer 4 (*)    GFR calc Af Amer 5 (*)    All other components within normal limits  CBC - Abnormal; Notable for the following:    Hemoglobin 18.1 (*)    HCT 54.4 (*)    All other components within normal limits    EKG  EKG Interpretation None       Radiology Ct Abdomen Pelvis W Contrast  Result Date: 11/08/2015 CLINICAL DATA:  Periumbilical pain, 2 days duration. Nausea and vomiting. EXAM: CT ABDOMEN AND PELVIS WITH CONTRAST TECHNIQUE: Multidetector CT imaging of the abdomen and pelvis was performed using the standard protocol following bolus administration of intravenous contrast. CONTRAST:  100 cc Isovue-300 COMPARISON:  03/24/2008 FINDINGS: Lower chest:  Lungs clear.  No pleural or pericardial fluid. Hepatobiliary: Liver appears normal. There are a few calcified gallstones  dependent within the gallbladder and 1 at the tip, presumably behind a pterygium cap. Largest stone measures 8 mm. No CT evidence of cholecystitis. There does appear to be a 3 mm stone either in the cystic duct or the common duct. Pancreas: Normal Spleen: Norm.  Upper limits of normal in size. Adrenals/Urinary Tract: Previously seen atrophic kidneys now show innumerable cysts consistent with the diagnosis of polycystic kidney disease. No definable solid mass. No obstruction. Adrenal glands are normal. Stomach/Bowel: No bowel pathology. Normal appearing appendix. Inguinal hernias containing only fat, left larger than right. Vascular/Lymphatic: Aortic atherosclerosis. No aneurysm. Right renal artery calcification.  No retroperitoneal mass or lymphadenopathy. IVC is normal. Reproductive: Normal Other: None. Musculoskeletal:  Ordinary mild lumbar degenerative changes. IMPRESSION: No acute bowel finding.  Normal appearing appendix. Chololithiasis without CT evidence of cholecystitis or obstruction. There is a 3 mm stone either in the cystic duct or the common bile duct. Polycystic kidney disease. Aortic atherosclerosis. Extensive calcification of the right renal artery which could result in renal artery stenosis. Bilateral inguinal hernias containing only fat, left larger than right. Electronically Signed   By: Nelson Chimes M.D.   On: 11/08/2015 07:34   Procedures Procedures (including critical care time)  Medications Ordered in ED Medications  ondansetron (ZOFRAN) injection 4 mg (4 mg Intravenous Given 11/08/15 0553)  metoCLOPramide (REGLAN) injection 10 mg (10 mg Intravenous Given 11/08/15 0553)  sodium chloride 0.9 % bolus 1,000 mL (0 mLs Intravenous Stopped 11/08/15 0736)  iopamidol (ISOVUE-300) 61 % injection (100 mLs  Contrast Given 11/08/15 0720)     Initial Impression / Assessment and Plan / ED Course  I have reviewed the triage vital signs and the nursing notes.  Pertinent labs & imaging results  that were available during my care of the patient were reviewed by me and considered in my medical decision making (see chart for details).  Clinical Course  Patient presents to the emergency department for abdominal pain.  He appears acutely ill, will obtain CT scan for evaluation. He was given Zofran and Reglan for symptoms. Also IV fluids. We'll continue to closely monitor.   CT reveals gallstone in the duct.  Likely the source of his N/V.  He was given gen surg fu as well as zofran and tramadol to take at home as needed.  LFTs are within normal range.  He appears well and in NAD.  Cholelithiasis education provided.  VS remain within his normal limits and he Is safe for DC. Final Clinical Impressions(s) / ED Diagnoses   Final diagnoses:  Calculus of gallbladder and bile duct without cholecystitis or obstruction    New Prescriptions Discharge Medication List as of 11/08/2015  7:39 AM       Everlene Balls, MD 11/08/15 1443

## 2015-11-08 NOTE — ED Triage Notes (Signed)
Pt. reports central abdominal pain with emesis onset 2 days ago , denies diarrhea or fever . Hemodialysis q Mon/Wed/Fri .

## 2016-05-13 ENCOUNTER — Encounter (HOSPITAL_COMMUNITY): Payer: Self-pay | Admitting: Emergency Medicine

## 2016-09-24 MED FILL — DOXYCYCLINE HYCLATE 100 MG: 100 | 7 days supply | Qty: 14 | Fill #0

## 2016-12-10 MED FILL — CICLOPIROX 0.77% CREAM: 0.77 | 30 days supply | Qty: 30 | Fill #0

## 2017-04-01 MED FILL — DOXYCYCLINE HYCLATE 100 MG: 100 | 10 days supply | Qty: 20 | Fill #0

## 2017-04-21 MED FILL — ETHYL CHLORIDE SPRAY: 30 days supply | Qty: 104 | Fill #0

## 2017-08-10 MED FILL — ETHYL CHLORIDE SPRAY: 15 days supply | Qty: 104 | Fill #0

## 2017-09-30 MED FILL — ETHYL CHLORIDE SPRAY: 30 days supply | Qty: 104 | Fill #0

## 2017-09-30 MED FILL — DOXYCYCLINE HYCLATE 100 MG: 100 | 10 days supply | Qty: 20 | Fill #0

## 2017-12-08 MED FILL — ETHYL CHLORIDE SPRAY: 30 days supply | Qty: 104 | Fill #1

## 2018-01-26 MED FILL — ETHYL CHLORIDE SPRAY: 15 days supply | Qty: 104 | Fill #1

## 2018-03-26 MED FILL — ETHYL CHLORIDE SPRAY: 30 days supply | Qty: 104 | Fill #0

## 2018-05-27 MED FILL — ETHYL CHLORIDE SPRAY: 30 days supply | Qty: 104 | Fill #2

## 2018-07-03 MED FILL — ETHYL CHLORIDE SPRAY: 30 days supply | Qty: 104 | Fill #3

## 2018-10-12 MED FILL — ETHYL CHLORIDE SPRAY: 30 days supply | Qty: 104 | Fill #0

## 2018-11-12 MED FILL — DOXYCYCLINE HYC 100 MG CAPS: 100 | 7 days supply | Qty: 15 | Fill #0

## 2019-09-16 ENCOUNTER — Ambulatory Visit (INDEPENDENT_AMBULATORY_CARE_PROVIDER_SITE_OTHER): Payer: Self-pay | Admitting: Physician Assistant

## 2019-09-16 ENCOUNTER — Other Ambulatory Visit: Payer: Self-pay

## 2019-09-16 VITALS — BP 89/62 | HR 79 | Temp 97.5°F | Resp 18 | Ht 60.0 in | Wt 167.5 lb

## 2019-09-16 DIAGNOSIS — Z992 Dependence on renal dialysis: Secondary | ICD-10-CM

## 2019-09-16 DIAGNOSIS — N186 End stage renal disease: Secondary | ICD-10-CM

## 2019-09-16 NOTE — Progress Notes (Signed)
[facility] HISTORY AND PHYSICAL   History of Present Illness:  Patient is a 49 y.o. year old male who presents  for evaluation of left UE AV fistula with enlarging aneurysm. He has had revision and plication of this fistula in 2013 and 2016.    He denise pain, prolonged bleeding or poor flow in the aneurysm.  They are not accessing the fistula at the aneurysmal area, they are sticking it above the area.     Past Medical History:  Diagnosis Date  . Anemia   . Anemia    pt denies this  . Chronic hepatitis   . Chronic kidney disease   . Complication of anesthesia   . Dialysis patient ([facility])    [place], [place], [place]  . End stage renal disease ([place])   . GERD (gastroesophageal reflux disease)   . History of hepatitis C   . History of hypoparathyroidism    pt denies this  . Hypertension   . Hypertension    pt denies this  . PONV (postoperative nausea and vomiting)   . Renal failure     Past Surgical History:  Procedure Laterality Date  . AV FISTULA PLACEMENT, BRACHIOCEPHALIC  59/56/38   Left arm by Dr. Donnetta Hutching  . PARATHYROIDECTOMY  01/14/11  . REVISON OF ARTERIOVENOUS FISTULA Left 05/11/2014   Procedure: RESECTION AND PLICATION OF ANEURSYM LEFT UPPER ARM FISTULA TIMES TWO;  Surgeon: Serafina Mitchell, MD;  Location: [facility] OR;  Service: Vascular;  Laterality: Left;     Social History Social History   Tobacco Use  . Smoking status: Former Smoker    Packs/day: 1.00    Years: 3.00    Pack years: 3.00    Types: Cigarettes    Quit date: 11/19/1995    Years since quitting: 23.8  . Smokeless tobacco: Never Used  . Tobacco comment: quit 7 yrs ago  Substance Use Topics  . Alcohol use: Yes    Comment: occasionally beer  . Drug use: No    Family History History reviewed. No pertinent family history.  Allergies  No Known Allergies   Current Outpatient Medications  Medication Sig Dispense Refill  . calcium carbonate (OS-CAL) 1250 MG chewable  tablet Chew 1 tablet by mouth daily.      . ondansetron (ZOFRAN ODT) 4 MG disintegrating tablet Take 1 tablet (4 mg total) by mouth every 8 (eight) hours as needed for nausea or vomiting. 12 tablet 0  . traMADol (ULTRAM) 50 MG tablet Take 1 tablet (50 mg total) by mouth every 12 (twelve) hours as needed for severe pain. 10 tablet 0   No current facility-administered medications for this visit.    ROS:   General:  No weight loss, Fever, chills  HEENT: No recent headaches, no nasal bleeding, no visual changes, no sore throat  Neurologic: No dizziness, blackouts, seizures. No recent symptoms of stroke or mini- stroke. No recent episodes of slurred speech, or temporary blindness.  Cardiac: No recent episodes of chest pain/pressure, no shortness of breath at rest.  No shortness of breath with exertion.  Denies history of atrial fibrillation or irregular heartbeat  Vascular: No history of rest pain in feet.  No history of claudication.  No history of non-healing ulcer, No history of DVT   Pulmonary: No home oxygen, no productive cough, no hemoptysis,  No asthma or wheezing  Musculoskeletal:  [ ]  Arthritis, [ ]  Low back pain,  [ ]  Joint pain  Hematologic:No history of hypercoagulable  state.  No history of easy bleeding.  No history of anemia  Gastrointestinal: No hematochezia or melena,  No gastroesophageal reflux, no trouble swallowing  Urinary: [ ]  chronic Kidney disease, [ ]  on HD - [ ]  MWF or [ ]  TTHS, [ ]  Burning with urination, [ ]  Frequent urination, [ ]  Difficulty urinating;   Skin: No rashes  Psychological: No history of anxiety,  No history of depression   Physical Examination  Vitals:   09/16/19 0835  BP: (!) 89/62  Pulse: 79  Resp: 18  Temp: (!) 97.5 F (36.4 C)  TempSrc: Temporal  SpO2: 97%  Weight: 167 lb 8.8 oz (76 kg)  Height: 5' (1.524 m)    Body mass index is 32.72 kg/m.  General:  Alert and oriented, no acute distress HEENT: Normal Neck: No bruit or  JVD Pulmonary: Clear to auscultation bilaterally Cardiac: Regular Rate and Rhythm without murmur Gastrointestinal: Soft, non-tender, non-distended, no mass, no scars Skin: No rash, left arm at [facility] with aneurysm that is soft with mobile dermis.   Extremity Pulses:  2+ radial, brachial pulses bilaterally.  Palpable thrill in fistula Musculoskeletal: No deformity or edema  Neurologic: Upper and lower extremity motor 5/5 and symmetric      ASSESSMENT:  Left UE AV fistula   PLAN: He denise pain, prolonged bleeding or poor flow in the aneurysm.  They are not accessing the fistula at the aneurysmal area, they are sticking it above the area.  The skin is healthy appearing at the aneurysm site.  He is not at risk of rupture.  I would avoid using the aneurysmal area for HD and continue using the area above.  This has been a very successful fistula since before 2013.  We do not recommend intervention.  If she performed a plication you would risk loosing the fistula.  If he develops problems he will call in the future.  Roxy Horseman PA-C [facility] Office: [phone]  MD in clinic [place]

## 2019-12-02 ENCOUNTER — Other Ambulatory Visit (HOSPITAL_COMMUNITY): Payer: Self-pay | Admitting: Nephrology

## 2020-03-14 MED FILL — hydrOXYzine HCL 25 MG TABS: 25 | 13 days supply | Qty: 50 | Fill #0

## 2020-03-21 ENCOUNTER — Other Ambulatory Visit (HOSPITAL_COMMUNITY): Payer: Self-pay | Admitting: Nephrology

## 2020-03-21 MED FILL — CYPROHEPTADINE HCL 4 MG TAB: 4 | 17 days supply | Qty: 50 | Fill #0

## 2020-11-29 ENCOUNTER — Other Ambulatory Visit: Payer: Self-pay

## 2020-11-29 ENCOUNTER — Ambulatory Visit (INDEPENDENT_AMBULATORY_CARE_PROVIDER_SITE_OTHER): Payer: Self-pay | Admitting: Physician Assistant

## 2020-11-29 VITALS — BP 86/62 | HR 89 | Temp 98.3°F | Resp 20 | Ht 60.0 in | Wt 168.0 lb

## 2020-11-29 DIAGNOSIS — Z992 Dependence on renal dialysis: Secondary | ICD-10-CM

## 2020-11-29 DIAGNOSIS — N186 End stage renal disease: Secondary | ICD-10-CM

## 2020-11-29 NOTE — Progress Notes (Signed)
Established Dialysis Access   History of Present Illness   Logan [name] is a 50 y.o. ([birth year]) male who presents for re-evaluation of permanent access.  Patient is Spanish-speaking thus an interpreter was used for today's visit.  He has a left brachiocephalic fistula which was plicated in 0000000 and again in 2016.  He was referred back to our office due to enlarging aneurysms and decreased flow through areas of aneurysmal degeneration.  He has not had any bleeding episodes.  He is able to complete HD treatments despite low flow areas.  He dialyzes on a Monday Wednesday Friday schedule.  He has not had any recent fistulogram.   Current Outpatient Medications  Medication Sig Dispense Refill   cyproheptadine (PERIACTIN) 4 MG tablet TAKE 1 TABLET BY MOUTH THREE TIMES DAILY AS NEEDED FOR ITCHING. (Patient not taking: Reported on 11/29/2020) 50 tablet 3   acetaminophen (TYLENOL) 500 MG tablet Take 500-1,000 mg by mouth every 6 (six) hours as needed (pain.).     calcium carbonate (TUMS EX) 750 MG chewable tablet Chew 5 tablets by mouth with breakfast, with lunch, and with evening meal.     ethyl chloride spray Apply 1 application topically every Monday, Wednesday, and Friday.     No current facility-administered medications for this visit.    REVIEW OF SYSTEMS (negative unless checked):   Cardiac:  '[]'$  Chest pain or chest pressure? '[]'$  Shortness of breath upon activity? '[]'$  Shortness of breath when lying flat? '[]'$  Irregular heart rhythm?  Vascular:  '[]'$  Pain in calf, thigh, or hip brought on by walking? '[]'$  Pain in feet at night that wakes you up from your sleep? '[]'$  Blood clot in your veins? '[]'$  Leg swelling?  Pulmonary:  '[]'$  Oxygen at home? '[]'$  Productive cough? '[]'$  Wheezing?  Neurologic:  '[]'$  Sudden weakness in arms or legs? '[]'$  Sudden numbness in arms or legs? '[]'$  Sudden onset of difficult speaking or slurred speech? '[]'$  Temporary loss of vision in one eye? '[]'$  Problems  with dizziness?  Gastrointestinal:  '[]'$  Blood in stool? '[]'$  Vomited blood?  Genitourinary:  '[]'$  Burning when urinating? '[]'$  Blood in urine?  Psychiatric:  '[]'$  Major depression  Hematologic:  '[]'$  Bleeding problems? '[]'$  Problems with blood clotting?  Dermatologic:  '[]'$  Rashes or ulcers?  Constitutional:  '[]'$  Fever or chills?  Ear/Nose/Throat:  '[]'$  Change in hearing? '[]'$  Nose bleeds? '[]'$  Sore throat?  Musculoskeletal:  '[]'$  Back pain? '[]'$  Joint pain? '[]'$  Muscle pain?   Physical Examination   Vitals:   11/29/20 1018  BP: (!) 86/62  Pulse: 89  Resp: 20  Temp: 98.3 F (36.8 C)  SpO2: 97%  Weight: 168 lb (76.2 kg)  Height: 5' (1.524 m)   Body mass index is 32.81 kg/m.  General:  WDWN in NAD; vital signs documented above Gait: Not observed HENT: WNL, normocephalic Pulmonary: normal non-labored breathing  Cardiac: regular HR,  Abdomen: soft, NT, no masses Skin: without rashes Vascular Exam/Pulses:  Right Left  Radial 2+ (normal) 2+ (normal)  Pulsatile flow through left arm AV fistula; hypopigmentation changes of fistula however no ulcerations Extremities: without ischemic changes, without Gangrene , without cellulitis; without open wounds;  Musculoskeletal: no muscle wasting or atrophy  Neurologic: A&O X 3;  No focal weakness or paresthesias are detected Psychiatric:  The pt has Normal affect.     Medical Decision Making   [name] is a 50 y.o. male who presents with ESRD requiring hemodialysis.   Patent left arm AV fistula  however pulsatile flow noted throughout Fistula has areas of aneurysmal degeneration as well as enlarged veins in the left shoulder suggesting possible outflow stenosis Plan will be for left arm fistulogram with possible intervention on a nondialysis day in the near future.  I explained to the patient that despite a large aneurysmal area near the [facility] fossa there is no risk for impending rupture.  This would not be an easy area to  revise however I will allow patient to discuss this with a surgeon pending results of fistulogram Risk, benefits, and alternatives to access surgery were discussed.   The patient is aware the risks include but are not limited to: bleeding, infection, steal syndrome, nerve damage, thrombosis, and need for additional procedures.   The patient agrees to proceed with the procedure.   Dagoberto Ligas PA-C [facility] [place] Office: [phone]  Clinic MD: Scot Dock

## 2020-11-29 NOTE — H&P (View-Only) (Signed)
Established Dialysis Access   History of Present Illness   Logan [name] is a 50 y.o. (1970/12/25) male who presents for re-evaluation of permanent access.  Patient is Spanish-speaking thus an interpreter was used for today's visit.  He has a left brachiocephalic fistula which was plicated in 0000000 and again in 2016.  He was referred back to our office due to enlarging aneurysms and decreased flow through areas of aneurysmal degeneration.  He has not had any bleeding episodes.  He is able to complete HD treatments despite low flow areas.  He dialyzes on a Monday Wednesday Friday schedule.  He has not had any recent fistulogram.   Current Outpatient Medications  Medication Sig Dispense Refill   cyproheptadine (PERIACTIN) 4 MG tablet TAKE 1 TABLET BY MOUTH THREE TIMES DAILY AS NEEDED FOR ITCHING. (Patient not taking: Reported on 11/29/2020) 50 tablet 3   acetaminophen (TYLENOL) 500 MG tablet Take 500-1,000 mg by mouth every 6 (six) hours as needed (pain.).     calcium carbonate (TUMS EX) 750 MG chewable tablet Chew 5 tablets by mouth with breakfast, with lunch, and with evening meal.     ethyl chloride spray Apply 1 application topically every Monday, Wednesday, and Friday.     No current facility-administered medications for this visit.    REVIEW OF SYSTEMS (negative unless checked):   Cardiac:  '[]'$  Chest pain or chest pressure? '[]'$  Shortness of breath upon activity? '[]'$  Shortness of breath when lying flat? '[]'$  Irregular heart rhythm?  Vascular:  '[]'$  Pain in calf, thigh, or hip brought on by walking? '[]'$  Pain in feet at night that wakes you up from your sleep? '[]'$  Blood clot in your veins? '[]'$  Leg swelling?  Pulmonary:  '[]'$  Oxygen at home? '[]'$  Productive cough? '[]'$  Wheezing?  Neurologic:  '[]'$  Sudden weakness in arms or legs? '[]'$  Sudden numbness in arms or legs? '[]'$  Sudden onset of difficult speaking or slurred speech? '[]'$  Temporary loss of vision in one eye? '[]'$  Problems  with dizziness?  Gastrointestinal:  '[]'$  Blood in stool? '[]'$  Vomited blood?  Genitourinary:  '[]'$  Burning when urinating? '[]'$  Blood in urine?  Psychiatric:  '[]'$  Major depression  Hematologic:  '[]'$  Bleeding problems? '[]'$  Problems with blood clotting?  Dermatologic:  '[]'$  Rashes or ulcers?  Constitutional:  '[]'$  Fever or chills?  Ear/Nose/Throat:  '[]'$  Change in hearing? '[]'$  Nose bleeds? '[]'$  Sore throat?  Musculoskeletal:  '[]'$  Back pain? '[]'$  Joint pain? '[]'$  Muscle pain?   Physical Examination   Vitals:   11/29/20 1018  BP: (!) 86/62  Pulse: 89  Resp: 20  Temp: 98.3 F (36.8 C)  SpO2: 97%  Weight: 168 lb (76.2 kg)  Height: 5' (1.524 m)   Body mass index is 32.81 kg/m.  General:  WDWN in NAD; vital signs documented above Gait: Not observed HENT: WNL, normocephalic Pulmonary: normal non-labored breathing  Cardiac: regular HR,  Abdomen: soft, NT, no masses Skin: without rashes Vascular Exam/Pulses:  Right Left  Radial 2+ (normal) 2+ (normal)  Pulsatile flow through left arm AV fistula; hypopigmentation changes of fistula however no ulcerations Extremities: without ischemic changes, without Gangrene , without cellulitis; without open wounds;  Musculoskeletal: no muscle wasting or atrophy  Neurologic: A&O X 3;  No focal weakness or paresthesias are detected Psychiatric:  The pt has Normal affect.     Medical Decision Making   Logan [name] is a 50 y.o. male who presents with ESRD requiring hemodialysis.   Patent left arm AV fistula  however pulsatile flow noted throughout Fistula has areas of aneurysmal degeneration as well as enlarged veins in the left shoulder suggesting possible outflow stenosis Plan will be for left arm fistulogram with possible intervention on a nondialysis day in the near future.  I explained to the patient that despite a large aneurysmal area near the [facility] fossa there is no risk for impending rupture.  This would not be an easy area to  revise however I will allow patient to discuss this with a surgeon pending results of fistulogram Risk, benefits, and alternatives to access surgery were discussed.   The patient is aware the risks include but are not limited to: bleeding, infection, steal syndrome, nerve damage, thrombosis, and need for additional procedures.   The patient agrees to proceed with the procedure.   Dagoberto Ligas PA-C [facility] [place] Office: [phone]  Clinic MD: Scot Dock

## 2020-11-29 NOTE — H&P (View-Only) (Signed)
Established Dialysis Access   History of Present Illness   Logan [name] is a 50 y.o. (17-Jun-1970) male who presents for re-evaluation of permanent access.  Patient is Spanish-speaking thus an interpreter was used for today's visit.  He has a left brachiocephalic fistula which was plicated in 0000000 and again in 2016.  He was referred back to our office due to enlarging aneurysms and decreased flow through areas of aneurysmal degeneration.  He has not had any bleeding episodes.  He is able to complete HD treatments despite low flow areas.  He dialyzes on a Monday Wednesday Friday schedule.  He has not had any recent fistulogram.   Current Outpatient Medications  Medication Sig Dispense Refill   cyproheptadine (PERIACTIN) 4 MG tablet TAKE 1 TABLET BY MOUTH THREE TIMES DAILY AS NEEDED FOR ITCHING. (Patient not taking: Reported on 11/29/2020) 50 tablet 3   acetaminophen (TYLENOL) 500 MG tablet Take 500-1,000 mg by mouth every 6 (six) hours as needed (pain.).     calcium carbonate (TUMS EX) 750 MG chewable tablet Chew 5 tablets by mouth with breakfast, with lunch, and with evening meal.     ethyl chloride spray Apply 1 application topically every Monday, Wednesday, and Friday.     No current facility-administered medications for this visit.    REVIEW OF SYSTEMS (negative unless checked):   Cardiac:  '[]'$  Chest pain or chest pressure? '[]'$  Shortness of breath upon activity? '[]'$  Shortness of breath when lying flat? '[]'$  Irregular heart rhythm?  Vascular:  '[]'$  Pain in calf, thigh, or hip brought on by walking? '[]'$  Pain in feet at night that wakes you up from your sleep? '[]'$  Blood clot in your veins? '[]'$  Leg swelling?  Pulmonary:  '[]'$  Oxygen at home? '[]'$  Productive cough? '[]'$  Wheezing?  Neurologic:  '[]'$  Sudden weakness in arms or legs? '[]'$  Sudden numbness in arms or legs? '[]'$  Sudden onset of difficult speaking or slurred speech? '[]'$  Temporary loss of vision in one eye? '[]'$  Problems  with dizziness?  Gastrointestinal:  '[]'$  Blood in stool? '[]'$  Vomited blood?  Genitourinary:  '[]'$  Burning when urinating? '[]'$  Blood in urine?  Psychiatric:  '[]'$  Major depression  Hematologic:  '[]'$  Bleeding problems? '[]'$  Problems with blood clotting?  Dermatologic:  '[]'$  Rashes or ulcers?  Constitutional:  '[]'$  Fever or chills?  Ear/Nose/Throat:  '[]'$  Change in hearing? '[]'$  Nose bleeds? '[]'$  Sore throat?  Musculoskeletal:  '[]'$  Back pain? '[]'$  Joint pain? '[]'$  Muscle pain?   Physical Examination   Vitals:   11/29/20 1018  BP: (!) 86/62  Pulse: 89  Resp: 20  Temp: 98.3 F (36.8 C)  SpO2: 97%  Weight: 168 lb (76.2 kg)  Height: 5' (1.524 m)   Body mass index is 32.81 kg/m.  General:  WDWN in NAD; vital signs documented above Gait: Not observed HENT: WNL, normocephalic Pulmonary: normal non-labored breathing  Cardiac: regular HR,  Abdomen: soft, NT, no masses Skin: without rashes Vascular Exam/Pulses:  Right Left  Radial 2+ (normal) 2+ (normal)  Pulsatile flow through left arm AV fistula; hypopigmentation changes of fistula however no ulcerations Extremities: without ischemic changes, without Gangrene , without cellulitis; without open wounds;  Musculoskeletal: no muscle wasting or atrophy  Neurologic: A&O X 3;  No focal weakness or paresthesias are detected Psychiatric:  The pt has Normal affect.     Medical Decision Making   Logan [name] is a 50 y.o. male who presents with ESRD requiring hemodialysis.   Patent left arm AV fistula  however pulsatile flow noted throughout Fistula has areas of aneurysmal degeneration as well as enlarged veins in the left shoulder suggesting possible outflow stenosis Plan will be for left arm fistulogram with possible intervention on a nondialysis day in the near future.  I explained to the patient that despite a large aneurysmal area near the [facility] fossa there is no risk for impending rupture.  This would not be an easy area to  revise however I will allow patient to discuss this with a surgeon pending results of fistulogram Risk, benefits, and alternatives to access surgery were discussed.   The patient is aware the risks include but are not limited to: bleeding, infection, steal syndrome, nerve damage, thrombosis, and need for additional procedures.   The patient agrees to proceed with the procedure.   Dagoberto Ligas PA-C [facility] [place] Office: [phone]  Clinic MD: Scot Dock

## 2020-12-04 ENCOUNTER — Other Ambulatory Visit: Payer: Self-pay

## 2020-12-04 ENCOUNTER — Ambulatory Visit (HOSPITAL_COMMUNITY)
Admission: RE | Admit: 2020-12-04 | Discharge: 2020-12-04 | Disposition: A | Discharge status: Home / Self Care | Payer: Self-pay | Attending: Surgery | Admitting: Surgery

## 2020-12-04 ENCOUNTER — Encounter (HOSPITAL_COMMUNITY)
Admission: RE | Disposition: A | Discharge status: Home / Self Care | Payer: Self-pay | Source: Home / Self Care | Attending: Surgery

## 2020-12-04 DIAGNOSIS — N186 End stage renal disease: Secondary | ICD-10-CM

## 2020-12-04 DIAGNOSIS — Z992 Dependence on renal dialysis: Secondary | ICD-10-CM | POA: Insufficient documentation

## 2020-12-04 DIAGNOSIS — Y841 Kidney dialysis as the cause of abnormal reaction of the patient, or of later complication, without mention of misadventure at the time of the procedure: Secondary | ICD-10-CM | POA: Insufficient documentation

## 2020-12-04 DIAGNOSIS — T82858A Stenosis of vascular prosthetic devices, implants and grafts, initial encounter: Secondary | ICD-10-CM

## 2020-12-04 DIAGNOSIS — T82510A Breakdown (mechanical) of surgically created arteriovenous fistula, initial encounter: Secondary | ICD-10-CM | POA: Insufficient documentation

## 2020-12-04 DIAGNOSIS — T82898A Other specified complication of vascular prosthetic devices, implants and grafts, initial encounter: Secondary | ICD-10-CM

## 2020-12-04 HISTORY — PX: PERIPHERAL VASCULAR BALLOON ANGIOPLASTY: CATH118281

## 2020-12-04 LAB — POCT I-STAT, CHEM 8
BUN: 58 mg/dL — ABNORMAL HIGH (ref 6–20)
Calcium, Ion: 0.94 mmol/L — ABNORMAL LOW (ref 1.15–1.40)
Chloride: 98 mmol/L (ref 98–111)
Creatinine, Ser: 13.9 mg/dL — ABNORMAL HIGH (ref 0.61–1.24)
Glucose, Bld: 99 mg/dL (ref 70–99)
HCT: 51 % (ref 39.0–52.0)
Hemoglobin: 17.3 g/dL — ABNORMAL HIGH (ref 13.0–17.0)
Potassium: 4.3 mmol/L (ref 3.5–5.1)
Sodium: 140 mmol/L (ref 135–145)
TCO2: 27 mmol/L (ref 22–32)

## 2020-12-04 SURGERY — PERIPHERAL VASCULAR BALLOON ANGIOPLASTY
Anesthesia: LOCAL | Laterality: Left

## 2020-12-04 MED ORDER — MIDAZOLAM HCL 2 MG/2ML IJ SOLN
INTRAMUSCULAR | Status: DC | PRN
  Administered 2020-12-04: 1 mg via INTRAVENOUS

## 2020-12-04 MED ORDER — SODIUM CHLORIDE 0.9 % IV SOLN: 250.0000 mL | INTRAVENOUS | Status: DC | PRN

## 2020-12-04 MED ORDER — FENTANYL CITRATE (PF) 100 MCG/2ML IJ SOLN
INTRAMUSCULAR | Status: AC
  Filled 2020-12-04: qty 2

## 2020-12-04 MED ORDER — HEPARIN (PORCINE) IN NACL 1000-0.9 UT/500ML-% IV SOLN
INTRAVENOUS | Status: DC | PRN
  Administered 2020-12-04: 500 mL

## 2020-12-04 MED ORDER — SODIUM CHLORIDE 0.9% FLUSH: 3.0000 mL | Freq: Two times a day (BID) | INTRAVENOUS | Status: DC

## 2020-12-04 MED ORDER — FENTANYL CITRATE (PF) 100 MCG/2ML IJ SOLN
INTRAMUSCULAR | Status: DC | PRN
  Administered 2020-12-04 (×2): 50 ug via INTRAVENOUS

## 2020-12-04 MED ORDER — LIDOCAINE HCL (PF) 1 % IJ SOLN
INTRAMUSCULAR | Status: DC | PRN
  Administered 2020-12-04: 3 mL via INTRADERMAL

## 2020-12-04 MED ORDER — MIDAZOLAM HCL 2 MG/2ML IJ SOLN
INTRAMUSCULAR | Status: AC
  Filled 2020-12-04: qty 2

## 2020-12-04 MED ORDER — SODIUM CHLORIDE 0.9% FLUSH: 3.0000 mL | INTRAVENOUS | Status: DC | PRN

## 2020-12-04 MED ORDER — IODIXANOL 320 MG/ML IV SOLN
INTRAVENOUS | Status: DC | PRN
  Administered 2020-12-04: 50 mL via INTRAVENOUS

## 2020-12-04 MED ORDER — LIDOCAINE HCL (PF) 1 % IJ SOLN
INTRAMUSCULAR | Status: AC
  Filled 2020-12-04: qty 30

## 2020-12-04 MED ORDER — HEPARIN (PORCINE) IN NACL 1000-0.9 UT/500ML-% IV SOLN
INTRAVENOUS | Status: AC
  Filled 2020-12-04: qty 500

## 2020-12-04 SURGICAL SUPPLY — 16 items
BALLN MUSTANG 12.0X40 75 (BALLOONS) ×2
BALLOON MUSTANG 12.0X40 75 (BALLOONS) IMPLANT
CATH ANGIO 5F BER2 65CM (CATHETERS) ×1 IMPLANT
COVER DOME SNAP 22 D (MISCELLANEOUS) ×2 IMPLANT
DEVICE TORQUE H2O (MISCELLANEOUS) ×1 IMPLANT
GUIDEWIRE ANGLED .035X150CM (WIRE) ×1 IMPLANT
KIT ENCORE 26 ADVANTAGE (KITS) ×1 IMPLANT
KIT MICROPUNCTURE NIT STIFF (SHEATH) ×1 IMPLANT
PROTECTION STATION PRESSURIZED (MISCELLANEOUS) ×2
SHEATH PINNACLE R/O II 7F 4CM (SHEATH) ×1 IMPLANT
SHEATH PROBE COVER 6X72 (BAG) ×2 IMPLANT
STATION PROTECTION PRESSURIZED (MISCELLANEOUS) ×1 IMPLANT
STOPCOCK MORSE 400PSI 3WAY (MISCELLANEOUS) ×2 IMPLANT
TRAY PV CATH (CUSTOM PROCEDURE TRAY) ×2 IMPLANT
TUBING CIL FLEX 10 FLL-RA (TUBING) ×2 IMPLANT
WIRE BENTSON .035X145CM (WIRE) ×1 IMPLANT

## 2020-12-04 NOTE — Op Note (Signed)
    Patient name: Logan [name] MRN: [id number] DOB: 1970-05-21 Sex: male  12/04/2020 Pre-operative Diagnosis: End-stage renal disease Post-operative diagnosis:  Same Surgeon:  Annamarie Major Procedure Performed:  1.  Ultrasound-guided access, left brachiocephalic fistula  2.  Fistulogram  3.  Venoplasty of the cephalic vein (peripheral vein) x2  4.  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  He comes in today for fistulogram  Procedure:  The patient was identified in the holding area and taken to room 8.  The patient was then placed supine on the table and prepped and draped in the usual sterile fashion.  A time out was called.  Conscious sedation was administered with the use of IV fentanyl and Versed under continuous physician and nurse monitoring.  Heart rate, blood pressure, and oxygen saturations were continuously monitored.  Total sedation time was 26 minutes ultrasound was used to evaluate the fistula.  The vein was patent and compressible.  A digital ultrasound image was acquired.  The fistula was then accessed under ultrasound guidance using a micropuncture needle.  An 018 wire was then asvanced without resistance and a micropuncture sheath was placed.  Contrast injections were then performed through the sheath.  Findings: The central venous system is widely patent.  There is a near occlusive stenosis at the cephalic vein junction to the central venous system.  Just distal to this there is another 60% stenosis.  The remaining portion of the fistula was ectatic.  The arterial venous anastomosis was widely patent.   Intervention: After the above images were acquired the decision was made to proceed with intervention.  A 7 French sheath was inserted.  I then advanced a Glidewire across the 2 areas of narrowing.  I selected a 12 x 40 Mustang  balloon and performed balloon venoplasty of the 2 lesions with 2 separate inflations.  Follow-up imaging revealed improved but suboptimal results and so I elected to reinsert the balloon and repeat balloon venoplasty.  On completion imaging there was significantly improved blood flow through this area.  Residual stenoses were less than 20%.  The cannulation site was closed with a Monocryl suture.  Impression:  #1  2 areas of stenosis within the left brachiocephalic fistula were successfully dilated using a 12 mm balloon.  #2  Aneurysmal proximal fistula.  The patient would like to have this plicated.  He will be scheduled in the near future.    Theotis Burrow, M.D., [facility] [place] Office: [phone] Pager:  [phone]

## 2020-12-04 NOTE — Interval H&P Note (Signed)
History and Physical Interval Note:  12/04/2020 12:49 PM  Logan [name]  has presented today for surgery, with the diagnosis of esrd.  The various methods of treatment have been discussed with the patient and family. After consideration of risks, benefits and other options for treatment, the patient has consented to  Procedure(s): A/V FISTULAGRAM (Left) as a surgical intervention.  The patient's history has been reviewed, patient examined, no change in status, stable for surgery.  I have reviewed the patient's chart and labs.  Questions were answered to the patient's satisfaction.     Annamarie Major

## 2020-12-05 ENCOUNTER — Encounter (HOSPITAL_COMMUNITY): Payer: Self-pay | Admitting: Surgery

## 2020-12-06 ENCOUNTER — Other Ambulatory Visit: Payer: Self-pay

## 2020-12-06 ENCOUNTER — Telehealth: Payer: Self-pay

## 2020-12-06 NOTE — Telephone Encounter (Signed)
Spoke with patient via Spanish language interpreter Brenda/ ID [id number] to schedule plication of left brachiocephalic fistula aneurysm on 12/13/20.

## 2020-12-12 ENCOUNTER — Encounter (HOSPITAL_COMMUNITY): Payer: Self-pay | Admitting: Surgery

## 2020-12-12 NOTE — Anesthesia Preprocedure Evaluation (Addendum)
Anesthesia Evaluation  Patient identified by MRN, date of birth, ID band Patient awake    Reviewed: Allergy & Precautions, NPO status , Patient's Chart, lab work & pertinent test results  History of Anesthesia Complications (+) PONV and history of anesthetic complications  Airway Mallampati: IV  TM Distance: >3 FB Neck ROM: Full    Dental  (+) Teeth Intact, Dental Advisory Given, Chipped,    Pulmonary former smoker,    Pulmonary exam normal breath sounds clear to auscultation       Cardiovascular hypertension, (-) anginaNormal cardiovascular exam+ dysrhythmias  Rhythm:Regular Rate:Normal     Neuro/Psych negative neurological ROS  negative psych ROS   GI/Hepatic negative GI ROS, (+) Hepatitis -, C  Endo/Other  negative endocrine ROS  Renal/GU ESRFRenal disease (MWF)     Musculoskeletal negative musculoskeletal ROS (+)   Abdominal   Peds  Hematology negative hematology ROS (+)   Anesthesia Other Findings   Reproductive/Obstetrics                            Anesthesia Physical Anesthesia Plan  ASA: 3  Anesthesia Plan: General   Post-op Pain Management:    Induction: Intravenous  PONV Risk Score and Plan: 3 and Midazolam, Dexamethasone, Ondansetron, TIVA and Diphenhydramine  Airway Management Planned: LMA  Additional Equipment:   Intra-op Plan:   Post-operative Plan: Extubation in OR  Informed Consent: I have reviewed the patients History and Physical, chart, labs and discussed the procedure including the risks, benefits and alternatives for the proposed anesthesia with the patient or authorized representative who has indicated his/her understanding and acceptance.     Dental advisory given  Plan Discussed with: CRNA  Anesthesia Plan Comments:        Anesthesia Quick Evaluation

## 2020-12-12 NOTE — Progress Notes (Signed)
DUE TO COVID-19 ONLY ONE VISITOR IS ALLOWED TO COME WITH YOU AND STAY IN THE WAITING ROOM ONLY DURING PRE OP AND PROCEDURE DAY OF SURGERY.   PCP - None Cardiologist - n/a  Chest x-ray - n/a EKG - 12/04/20 Stress Test - n/a ECHO - n/a Cardiac Cath - n/a  Sleep Study -  n/a CPAP - none  Anesthesia review: Yes  STOP now taking any Aspirin (unless otherwise instructed by your surgeon), Aleve, Naproxen, Ibuprofen, Motrin, Advil, Goody's, BC's, all herbal medications, fish oil, and all vitamins.   Coronavirus Screening Covid test n/a - Ambulatory Surgery  Do you have any of the following symptoms:  Cough yes/no: No Fever (>100.39F)  yes/no: No Runny nose yes/no: No Sore throat yes/no: No Difficulty breathing/shortness of breath  yes/no: No  Have you traveled in the last 14 days and where? yes/no: No  Patient verbalized understanding of instructions that were given via phone with using a [employer] # [phone].

## 2020-12-13 ENCOUNTER — Encounter (HOSPITAL_COMMUNITY)
Admission: RE | Disposition: A | Discharge status: Home / Self Care | Payer: Self-pay | Source: Home / Self Care | Attending: Surgery

## 2020-12-13 ENCOUNTER — Ambulatory Visit (HOSPITAL_COMMUNITY): Payer: Self-pay | Admitting: Physician Assistant

## 2020-12-13 ENCOUNTER — Ambulatory Visit (HOSPITAL_COMMUNITY)
Admission: RE | Admit: 2020-12-13 | Discharge: 2020-12-13 | Disposition: A | Discharge status: Home / Self Care | Payer: Self-pay | Attending: Surgery | Admitting: Surgery

## 2020-12-13 ENCOUNTER — Encounter (HOSPITAL_COMMUNITY): Payer: Self-pay | Admitting: Surgery

## 2020-12-13 ENCOUNTER — Other Ambulatory Visit: Payer: Self-pay

## 2020-12-13 DIAGNOSIS — N186 End stage renal disease: Secondary | ICD-10-CM

## 2020-12-13 DIAGNOSIS — Z992 Dependence on renal dialysis: Secondary | ICD-10-CM | POA: Insufficient documentation

## 2020-12-13 DIAGNOSIS — Y841 Kidney dialysis as the cause of abnormal reaction of the patient, or of later complication, without mention of misadventure at the time of the procedure: Secondary | ICD-10-CM | POA: Insufficient documentation

## 2020-12-13 DIAGNOSIS — T82510A Breakdown (mechanical) of surgically created arteriovenous fistula, initial encounter: Secondary | ICD-10-CM | POA: Insufficient documentation

## 2020-12-13 DIAGNOSIS — T82898A Other specified complication of vascular prosthetic devices, implants and grafts, initial encounter: Secondary | ICD-10-CM

## 2020-12-13 HISTORY — DX: Angina pectoris, unspecified: I20.9

## 2020-12-13 HISTORY — DX: Cardiac arrhythmia, unspecified: I49.9

## 2020-12-13 HISTORY — PX: FISTULA SUPERFICIALIZATION: SHX6341

## 2020-12-13 LAB — POCT I-STAT, CHEM 8
BUN: 59 mg/dL — ABNORMAL HIGH (ref 6–20)
Calcium, Ion: 0.89 mmol/L — CL (ref 1.15–1.40)
Chloride: 95 mmol/L — ABNORMAL LOW (ref 98–111)
Creatinine, Ser: 13.3 mg/dL — ABNORMAL HIGH (ref 0.61–1.24)
Glucose, Bld: 103 mg/dL — ABNORMAL HIGH (ref 70–99)
HCT: 47 % (ref 39.0–52.0)
Hemoglobin: 16 g/dL (ref 13.0–17.0)
Potassium: 4.1 mmol/L (ref 3.5–5.1)
Sodium: 140 mmol/L (ref 135–145)
TCO2: 33 mmol/L — ABNORMAL HIGH (ref 22–32)

## 2020-12-13 SURGERY — FISTULA SUPERFICIALIZATION
Anesthesia: General | Site: Arm Upper | Laterality: Left

## 2020-12-13 MED ORDER — SCOPOLAMINE 1 MG/3DAYS TD PT72
MEDICATED_PATCH | TRANSDERMAL | Status: AC
  Filled 2020-12-13: qty 1

## 2020-12-13 MED ORDER — MIDAZOLAM HCL 5 MG/5ML IJ SOLN
INTRAMUSCULAR | Status: DC | PRN
  Administered 2020-12-13: 2 mg via INTRAVENOUS

## 2020-12-13 MED ORDER — PROPOFOL 10 MG/ML IV BOLUS
INTRAVENOUS | Status: AC
  Filled 2020-12-13: qty 40

## 2020-12-13 MED ORDER — DIPHENHYDRAMINE HCL 50 MG/ML IJ SOLN
INTRAMUSCULAR | Status: DC | PRN
  Administered 2020-12-13: 12.5 mg via INTRAVENOUS

## 2020-12-13 MED ORDER — LIDOCAINE 2% (20 MG/ML) 5 ML SYRINGE
INTRAMUSCULAR | Status: DC | PRN
  Administered 2020-12-13: 80 mg via INTRAVENOUS

## 2020-12-13 MED ORDER — CEFAZOLIN SODIUM-DEXTROSE 2-4 GM/100ML-% IV SOLN
2.0000 g | INTRAVENOUS | Status: AC
  Administered 2020-12-13: 2 g via INTRAVENOUS
  Filled 2020-12-13: qty 100

## 2020-12-13 MED ORDER — SODIUM CHLORIDE 0.9 % IV SOLN: INTRAVENOUS | Status: DC

## 2020-12-13 MED ORDER — PHENYLEPHRINE 40 MCG/ML (10ML) SYRINGE FOR IV PUSH (FOR BLOOD PRESSURE SUPPORT)
PREFILLED_SYRINGE | INTRAVENOUS | Status: DC | PRN
  Administered 2020-12-13 (×2): 80 ug via INTRAVENOUS
  Administered 2020-12-13: 120 ug via INTRAVENOUS

## 2020-12-13 MED ORDER — OXYCODONE-ACETAMINOPHEN 5-325 MG PO TABS: 1.0000 | ORAL_TABLET | Freq: Four times a day (QID) | ORAL | 0 refills | Status: DC | PRN

## 2020-12-13 MED ORDER — PHENYLEPHRINE HCL (PRESSORS) 10 MG/ML IV SOLN
INTRAVENOUS | Status: AC
  Filled 2020-12-13: qty 1

## 2020-12-13 MED ORDER — ACETAMINOPHEN 500 MG PO TABS
1000.0000 mg | ORAL_TABLET | Freq: Once | ORAL | Status: AC
  Administered 2020-12-13: 1000 mg via ORAL
  Filled 2020-12-13: qty 2

## 2020-12-13 MED ORDER — ALBUMIN HUMAN 5 % IV SOLN
INTRAVENOUS | Status: AC
  Filled 2020-12-13: qty 250

## 2020-12-13 MED ORDER — LIDOCAINE-EPINEPHRINE (PF) 1 %-1:200000 IJ SOLN
INTRAMUSCULAR | Status: DC | PRN
  Administered 2020-12-13: 6 mL

## 2020-12-13 MED ORDER — SCOPOLAMINE 1 MG/3DAYS TD PT72: 1.0000 | MEDICATED_PATCH | Freq: Once | TRANSDERMAL | Status: DC

## 2020-12-13 MED ORDER — MIDAZOLAM HCL 2 MG/2ML IJ SOLN
INTRAMUSCULAR | Status: AC
  Filled 2020-12-13: qty 2

## 2020-12-13 MED ORDER — DEXAMETHASONE SODIUM PHOSPHATE 4 MG/ML IJ SOLN
INTRAMUSCULAR | Status: DC | PRN
  Administered 2020-12-13: 10 mg via INTRAVENOUS

## 2020-12-13 MED ORDER — HEPARIN 6000 UNIT IRRIGATION SOLUTION
Status: AC
  Filled 2020-12-13: qty 500

## 2020-12-13 MED ORDER — PROPOFOL 10 MG/ML IV BOLUS
INTRAVENOUS | Status: DC | PRN
Start: 2020-12-13 — End: 2020-12-13
  Administered 2020-12-13: 50 mg via INTRAVENOUS
  Administered 2020-12-13: 150 mg via INTRAVENOUS

## 2020-12-13 MED ORDER — CHLORHEXIDINE GLUCONATE 4 % EX LIQD: 60.0000 mL | Freq: Once | CUTANEOUS | Status: DC

## 2020-12-13 MED ORDER — FENTANYL CITRATE (PF) 100 MCG/2ML IJ SOLN: 25.0000 ug | INTRAMUSCULAR | Status: DC | PRN

## 2020-12-13 MED ORDER — FENTANYL CITRATE (PF) 100 MCG/2ML IJ SOLN
INTRAMUSCULAR | Status: DC | PRN
  Administered 2020-12-13 (×2): 50 ug via INTRAVENOUS

## 2020-12-13 MED ORDER — ORAL CARE MOUTH RINSE: 15.0000 mL | Freq: Once | OROMUCOSAL | Status: AC

## 2020-12-13 MED ORDER — FENTANYL CITRATE (PF) 250 MCG/5ML IJ SOLN
INTRAMUSCULAR | Status: AC
  Filled 2020-12-13: qty 5

## 2020-12-13 MED ORDER — PHENYLEPHRINE HCL-NACL 20-0.9 MG/250ML-% IV SOLN
INTRAVENOUS | Status: DC | PRN
  Administered 2020-12-13: 30 ug/min via INTRAVENOUS

## 2020-12-13 MED ORDER — PROPOFOL 500 MG/50ML IV EMUL
INTRAVENOUS | Status: DC | PRN
  Administered 2020-12-13: 125 ug/kg/min via INTRAVENOUS

## 2020-12-13 MED ORDER — ALBUMIN HUMAN 5 % IV SOLN
12.5000 g | Freq: Once | INTRAVENOUS | Status: AC
  Administered 2020-12-13: 12.5 g via INTRAVENOUS

## 2020-12-13 MED ORDER — CHLORHEXIDINE GLUCONATE 4 % EX LIQD
60.0000 mL | Freq: Once | CUTANEOUS | Status: DC
Start: 2020-12-13 — End: 2020-12-13

## 2020-12-13 MED ORDER — DEXMEDETOMIDINE (PRECEDEX) IN NS 20 MCG/5ML (4 MCG/ML) IV SYRINGE
PREFILLED_SYRINGE | INTRAVENOUS | Status: DC | PRN
  Administered 2020-12-13 (×2): 8 ug via INTRAVENOUS

## 2020-12-13 MED ORDER — HEPARIN 6000 UNIT IRRIGATION SOLUTION
Status: DC | PRN
  Administered 2020-12-13: 1

## 2020-12-13 MED ORDER — ONDANSETRON HCL 4 MG/2ML IJ SOLN: 4.0000 mg | Freq: Once | INTRAMUSCULAR | Status: DC | PRN

## 2020-12-13 MED ORDER — LIDOCAINE-EPINEPHRINE (PF) 1 %-1:200000 IJ SOLN
INTRAMUSCULAR | Status: AC
  Filled 2020-12-13: qty 30

## 2020-12-13 MED ORDER — CHLORHEXIDINE GLUCONATE 0.12 % MT SOLN
15.0000 mL | Freq: Once | OROMUCOSAL | Status: AC
  Administered 2020-12-13: 15 mL via OROMUCOSAL

## 2020-12-13 MED ORDER — 0.9 % SODIUM CHLORIDE (POUR BTL) OPTIME
TOPICAL | Status: DC | PRN
  Administered 2020-12-13: 1000 mL

## 2020-12-13 MED ORDER — ONDANSETRON HCL 4 MG/2ML IJ SOLN
INTRAMUSCULAR | Status: DC | PRN
  Administered 2020-12-13: 4 mg via INTRAVENOUS

## 2020-12-13 SURGICAL SUPPLY — 37 items
ADH SKN CLS APL DERMABOND .7 (GAUZE/BANDAGES/DRESSINGS) ×1
ARMBAND PINK RESTRICT EXTREMIT (MISCELLANEOUS) ×3 IMPLANT
BAG COUNTER SPONGE SURGICOUNT (BAG) ×2 IMPLANT
BAG SPNG CNTER NS LX DISP (BAG) ×1
BAG SURGICOUNT SPONGE COUNTING (BAG) ×1
BNDG ELASTIC 4X5.8 VLCR STR LF (GAUZE/BANDAGES/DRESSINGS) ×2 IMPLANT
BNDG GAUZE ELAST 4 BULKY (GAUZE/BANDAGES/DRESSINGS) ×2 IMPLANT
CANISTER SUCT 3000ML PPV (MISCELLANEOUS) ×3 IMPLANT
CLIP VESOCCLUDE MED 6/CT (CLIP) ×3 IMPLANT
CLIP VESOCCLUDE SM WIDE 6/CT (CLIP) ×3 IMPLANT
COVER PROBE W GEL 5X96 (DRAPES) ×3 IMPLANT
DERMABOND ADVANCED (GAUZE/BANDAGES/DRESSINGS) ×2
DERMABOND ADVANCED .7 DNX12 (GAUZE/BANDAGES/DRESSINGS) ×1 IMPLANT
ELECT REM PT RETURN 9FT ADLT (ELECTROSURGICAL) ×3
ELECTRODE REM PT RTRN 9FT ADLT (ELECTROSURGICAL) ×1 IMPLANT
GLOVE SRG 8 PF TXTR STRL LF DI (GLOVE) ×1 IMPLANT
GLOVE SURG POLYISO LF SZ7.5 (GLOVE) ×3 IMPLANT
GLOVE SURG UNDER POLY LF SZ8 (GLOVE) ×3
GOWN STRL REUS W/ TWL LRG LVL3 (GOWN DISPOSABLE) ×2 IMPLANT
GOWN STRL REUS W/ TWL XL LVL3 (GOWN DISPOSABLE) ×1 IMPLANT
GOWN STRL REUS W/TWL LRG LVL3 (GOWN DISPOSABLE) ×6
GOWN STRL REUS W/TWL XL LVL3 (GOWN DISPOSABLE) ×3
HEMOSTAT SNOW SURGICEL 2X4 (HEMOSTASIS) IMPLANT
KIT BASIN OR (CUSTOM PROCEDURE TRAY) ×3 IMPLANT
KIT TURNOVER KIT B (KITS) ×3 IMPLANT
NS IRRIG 1000ML POUR BTL (IV SOLUTION) ×3 IMPLANT
PACK CV ACCESS (CUSTOM PROCEDURE TRAY) ×3 IMPLANT
PAD ARMBOARD 7.5X6 YLW CONV (MISCELLANEOUS) ×6 IMPLANT
SPONGE T-LAP 18X18 ~~LOC~~+RFID (SPONGE) ×2 IMPLANT
SUT PROLENE 5 0 C 1 24 (SUTURE) ×6 IMPLANT
SUT PROLENE 6 0 CC (SUTURE) ×3 IMPLANT
SUT VIC AB 3-0 SH 27 (SUTURE) ×6
SUT VIC AB 3-0 SH 27X BRD (SUTURE) ×1 IMPLANT
SUT VICRYL 4-0 PS2 18IN ABS (SUTURE) IMPLANT
TOWEL GREEN STERILE (TOWEL DISPOSABLE) ×3 IMPLANT
UNDERPAD 30X36 HEAVY ABSORB (UNDERPADS AND DIAPERS) ×3 IMPLANT
WATER STERILE IRR 1000ML POUR (IV SOLUTION) ×3 IMPLANT

## 2020-12-13 NOTE — Anesthesia Procedure Notes (Signed)
Procedure Name: LMA Insertion Date/Time: 12/13/2020 7:40 AM Performed by: Griffin Dakin, CRNA Pre-anesthesia Checklist: Patient identified, Emergency Drugs available, Suction available, Patient being monitored and Timeout performed Patient Re-evaluated:Patient Re-evaluated prior to induction Oxygen Delivery Method: Circle system utilized Preoxygenation: Pre-oxygenation with 100% oxygen Induction Type: IV induction LMA: LMA inserted LMA Size: 5.0 Placement Confirmation: positive ETCO2 and breath sounds checked- equal and bilateral Tube secured with: Tape Dental Injury: Teeth and Oropharynx as per pre-operative assessment

## 2020-12-13 NOTE — Anesthesia Postprocedure Evaluation (Signed)
Anesthesia Post Note  Patient: Logan [name]  Procedure(s) Performed: PLICATION OF LEFT BRACHIOCEPHALIC FISTULA ANEURYSM (Left: Arm Upper)     Patient location during evaluation: PACU Anesthesia Type: General Level of consciousness: awake and alert Pain management: pain level controlled Vital Signs Assessment: post-procedure vital signs reviewed and stable Respiratory status: spontaneous breathing, nonlabored ventilation and respiratory function stable Cardiovascular status: blood pressure returned to baseline and stable Postop Assessment: no apparent nausea or vomiting Anesthetic complications: no   No notable events documented.  Last Vitals:  Vitals:   12/13/20 1030 12/13/20 1045  BP: (!) 80/60 (!) 82/61  Pulse: 68 66  Resp: 15 17  Temp:  36.4 C  SpO2: 94% 95%    Last Pain:  Vitals:   12/13/20 1045  TempSrc:   PainSc: 0-No pain                 Catalina Gravel

## 2020-12-13 NOTE — Interval H&P Note (Signed)
History and Physical Interval Note:  12/13/2020 7:24 AM  Logan [name]  has presented today for surgery, with the diagnosis of ANEURYSM OF AV FISTULA.  The various methods of treatment have been discussed with the patient and family. After consideration of risks, benefits and other options for treatment, the patient has consented to  Procedure(s): PLICATION OF LEFT BRACHIOCEPHALIC FISTULA ANEURYSM (Left) as a surgical intervention.  The patient's history has been reviewed, patient examined, no change in status, stable for surgery.  I have reviewed the patient's chart and labs.  Questions were answered to the patient's satisfaction.     [name]  Discussed plication of fistula.  All questions answered  [name]

## 2020-12-13 NOTE — Op Note (Signed)
    Patient name: Logan [name] MRN: [id number] DOB: 10-May-1970 Sex: male  12/13/2020 Pre-operative Diagnosis: ESRD Post-operative diagnosis:  Same Surgeon:  Annamarie Major Assistants:  Leontine Locket Procedure:   Revision of left brachiocephalic fistula (plication of aneurysm) Anesthesia:  General Blood Loss:  minimal Specimens:  none  Findings: Large proximal fistula aneurysm was plicated.  I resected approximately two thirds aneurysmal tissue and closed it primarily with a running suture in 2 layers  Indications: This is a 50 year old gentleman with aneurysmal left arm fistula.  He is recently undergone percutaneous intervention for venous stenosis.  He comes in today for revision of his aneurysmal section.  Procedure:  The patient was identified in the holding area and taken to [place] 11  The patient was then placed supine on the table. general anesthesia was administered.  The patient was prepped and draped in the usual sterile fashion.  A time out was called and antibiotics were administered.  A PA was necessary to expedite the procedure and assist with technical details.  An elliptical incision was made over top of the large aneurysm within his proximal fistula.  Cautery was used to divide subcutaneous tissue and circumferentially exposed the aneurysmal portion of the fistula.  Once I had adequate exposure, the aneurysmal section of the fistula was occluded with baby Gregory clamps.  I then opened the fistula with an 11 blade and resected approximately two thirds of the aneurysmal tissue.  I then primarily closed the fistula with 5-0 Prolene in 2 layers.  Clamps were released.  There continues to be an excellent thrill within his fistula.  The wound was irrigated.  Hemostasis was achieved.  The subcutaneous tissue was reapproximated with 3-0 Vicryl and skin was closed with 3-0 Vicryl followed by Dermabond.  An Ace wrap was placed on his arm.  There was no immediate  complications.   Disposition: To PACU stable.   Theotis Burrow, M.D., [facility] [place] Office: [phone] Pager:  [phone]

## 2020-12-13 NOTE — Transfer of Care (Signed)
Immediate Anesthesia Transfer of Care Note  Patient: Logan [name]  Procedure(s) Performed: PLICATION OF LEFT BRACHIOCEPHALIC FISTULA ANEURYSM (Left: Arm Upper)  Patient Location: PACU  Anesthesia Type:General  Level of Consciousness: awake, alert  and oriented  Airway & Oxygen Therapy: Patient Spontanous Breathing and Patient connected to nasal cannula oxygen  Post-op Assessment: Report given to RN and Post -op Vital signs reviewed and stable  Post vital signs: Reviewed and stable  Last Vitals:  Vitals Value Taken Time  BP 83/62 12/13/20 0918  Temp    Pulse 80 12/13/20 0927  Resp 15 12/13/20 0927  SpO2 94 % 12/13/20 0927  Vitals shown include unvalidated device data.  Last Pain:  Vitals:   12/13/20 0700  TempSrc:   PainSc: 0-No pain         Complications: No notable events documented.

## 2020-12-13 NOTE — Discharge Instructions (Signed)
[facility]  Discharge Instructions  AV Fistula or Graft Surgery for Dialysis Access  Please refer to the following instructions for your post-procedure care. Your surgeon or physician assistant will discuss any changes with you.  Activity  You may drive the day following your surgery, if you are comfortable and no longer taking prescription pain medication. Resume full activity as the soreness in your incision resolves.  Bathing/Showering  You may shower after you go home. Keep your incision dry for 48 hours. Do not soak in a bathtub, hot tub, or swim until the incision heals completely. You may not shower if you have a hemodialysis catheter.  Incision Care  Clean your incision with mild soap and water after 48 hours. Pat the area dry with a clean towel. You do not need a bandage unless otherwise instructed. Do not apply any ointments or creams to your incision. You may have skin glue on your incision. Do not peel it off. It will come off on its own in about one week. Your arm may swell a bit after surgery. To reduce swelling use pillows to elevate your arm so it is above your heart. Your doctor will tell you if you need to lightly wrap your arm with an ACE bandage.  Diet  Resume your normal diet. There are not special food restrictions following this procedure. In order to heal from your surgery, it is CRITICAL to get adequate nutrition. Your body requires vitamins, minerals, and protein. Vegetables are the best source of vitamins and minerals. Vegetables also provide the perfect balance of protein. Processed food has little nutritional value, so try to avoid this.  Medications  Resume taking all of your medications. If your incision is causing pain, you may take over-the counter pain relievers such as acetaminophen (Tylenol). If you were prescribed a stronger pain medication, please be aware these medications can cause nausea and constipation. Prevent  nausea by taking the medication with a snack or meal. Avoid constipation by drinking plenty of fluids and eating foods with high amount of fiber, such as fruits, vegetables, and grains.  Do not take Tylenol if you are taking prescription pain medications.  Follow up Your surgeon may want to see you in the office following your access surgery. If so, this will be arranged at the time of your surgery.  Please call us immediately for any of the following conditions:  Increased pain, redness, drainage (pus) from your incision site Fever of 101 degrees or higher Severe or worsening pain at your incision site Hand pain or numbness.  Reduce your risk of vascular disease:  Stop smoking. If you would like help, call [facility] at 1-800-QUIT-NOW 480-120-9751) or [place] at [place] your cholesterol Maintain a desired weight Control your diabetes Keep your blood pressure down  Dialysis  It will take several weeks to several months for your new dialysis access to be ready for use. Your surgeon will determine when it is okay to use it. Your nephrologist will continue to direct your dialysis. You can continue to use your Permcath until your new access is ready for use.   12/13/2020 BRYLEY AYLSWORTH OO:[id number] 09-23-1970  Surgeon(s): Serafina Mitchell, MD  Procedure(s): PLICATION OF LEFT BRACHIOCEPHALIC FISTULA ANEURYSM  x May stick graft on designated area only:  Do NOT stick over incision x 4 weeks. Okay to stick above incision now.  SEE DIAGRAM   If you have any questions, please call the office at  [phone].  

## 2020-12-14 ENCOUNTER — Encounter (HOSPITAL_COMMUNITY): Payer: Self-pay | Admitting: Surgery

## 2021-04-22 ENCOUNTER — Other Ambulatory Visit (HOSPITAL_COMMUNITY): Payer: Self-pay

## 2021-04-22 ENCOUNTER — Telehealth: Payer: Self-pay

## 2021-04-22 NOTE — Telephone Encounter (Signed)
[facility] Patient Advocate Encounter ? ?Insurance verification completed.   ? ?The patient is uninsured and will need patient assistance for medication. ? ?We can complete the application and will need to meet with the patient for signatures and income documentation. ? ?Leelan Rajewski, CPhT ?Specialty Pharmacy Patient Advocate ?[facility] ?Phone: [phone] ?Fax:  [phone]  ?

## 2021-04-23 ENCOUNTER — Encounter: Payer: Self-pay | Admitting: Internal Medicine

## 2021-12-19 ENCOUNTER — Other Ambulatory Visit (HOSPITAL_COMMUNITY): Payer: Self-pay | Admitting: Nephrology

## 2021-12-19 DIAGNOSIS — N186 End stage renal disease: Secondary | ICD-10-CM

## 2021-12-25 ENCOUNTER — Other Ambulatory Visit: Payer: Self-pay | Admitting: Internal Medicine

## 2021-12-25 DIAGNOSIS — N186 End stage renal disease: Secondary | ICD-10-CM

## 2021-12-26 ENCOUNTER — Ambulatory Visit (HOSPITAL_COMMUNITY)
Admission: RE | Admit: 2021-12-26 | Discharge: 2021-12-26 | Disposition: A | Discharge status: Home / Self Care | Payer: Self-pay | Source: Ambulatory Visit | Attending: Nephrology | Admitting: Nephrology

## 2021-12-26 DIAGNOSIS — N186 End stage renal disease: Secondary | ICD-10-CM

## 2021-12-26 MED ORDER — SODIUM CHLORIDE 0.9 % IV SOLN: INTRAVENOUS | Status: DC

## 2022-01-06 ENCOUNTER — Other Ambulatory Visit (HOSPITAL_COMMUNITY): Payer: Self-pay | Admitting: Physician Assistant

## 2022-01-06 DIAGNOSIS — Z01818 Encounter for other preprocedural examination: Secondary | ICD-10-CM

## 2022-01-07 ENCOUNTER — Ambulatory Visit (HOSPITAL_COMMUNITY): Admission: RE | Admit: 2022-01-07 | Payer: Self-pay | Source: Ambulatory Visit

## 2022-01-14 ENCOUNTER — Ambulatory Visit (HOSPITAL_COMMUNITY): Payer: Self-pay

## 2022-01-16 ENCOUNTER — Other Ambulatory Visit: Payer: Self-pay | Admitting: Radiology

## 2022-01-21 ENCOUNTER — Ambulatory Visit (HOSPITAL_COMMUNITY)
Admission: RE | Admit: 2022-01-21 | Discharge: 2022-01-21 | Disposition: A | Discharge status: Home / Self Care | Payer: Self-pay | Source: Ambulatory Visit | Attending: Nephrology | Admitting: Nephrology

## 2022-01-21 ENCOUNTER — Encounter (HOSPITAL_COMMUNITY): Payer: Self-pay

## 2022-01-21 ENCOUNTER — Other Ambulatory Visit (HOSPITAL_COMMUNITY): Payer: Self-pay | Admitting: Nephrology

## 2022-01-21 VITALS — BP 160/91 | HR 68 | Temp 97.8°F | Resp 18 | Ht 65.0 in | Wt 165.3 lb

## 2022-01-21 DIAGNOSIS — N186 End stage renal disease: Secondary | ICD-10-CM

## 2022-01-21 DIAGNOSIS — I120 Hypertensive chronic kidney disease with stage 5 chronic kidney disease or end stage renal disease: Secondary | ICD-10-CM | POA: Insufficient documentation

## 2022-01-21 DIAGNOSIS — Z992 Dependence on renal dialysis: Secondary | ICD-10-CM | POA: Insufficient documentation

## 2022-01-21 DIAGNOSIS — Y841 Kidney dialysis as the cause of abnormal reaction of the patient, or of later complication, without mention of misadventure at the time of the procedure: Secondary | ICD-10-CM | POA: Insufficient documentation

## 2022-01-21 DIAGNOSIS — D631 Anemia in chronic kidney disease: Secondary | ICD-10-CM | POA: Insufficient documentation

## 2022-01-21 DIAGNOSIS — T82858A Stenosis of vascular prosthetic devices, implants and grafts, initial encounter: Secondary | ICD-10-CM | POA: Insufficient documentation

## 2022-01-21 DIAGNOSIS — K219 Gastro-esophageal reflux disease without esophagitis: Secondary | ICD-10-CM | POA: Insufficient documentation

## 2022-01-21 HISTORY — PX: IR US GUIDE VASC ACCESS LEFT: IMG2389

## 2022-01-21 HISTORY — PX: IR AV DIALY SHUNT INTRO NEEDLE/INTRACATH INITIAL W/PTA/IMG LEFT: IMG6103

## 2022-01-21 LAB — BASIC METABOLIC PANEL
Anion gap: 16 — ABNORMAL HIGH (ref 5–15)
BUN: 68 mg/dL — ABNORMAL HIGH (ref 6–20)
CO2: 26 mmol/L (ref 22–32)
Calcium: 7.7 mg/dL — ABNORMAL LOW (ref 8.9–10.3)
Chloride: 100 mmol/L (ref 98–111)
Creatinine, Ser: 12.69 mg/dL — ABNORMAL HIGH (ref 0.61–1.24)
GFR, Estimated: 4 mL/min — ABNORMAL LOW (ref >60)
Glucose, Bld: 83 mg/dL (ref 70–99)
Potassium: 5 mmol/L (ref 3.5–5.1)
Sodium: 142 mmol/L (ref 135–145)

## 2022-01-21 MED ORDER — HEPARIN SODIUM (PORCINE) 1000 UNIT/ML IJ SOLN
INTRAMUSCULAR | Status: AC
  Filled 2022-01-21: qty 10

## 2022-01-21 MED ORDER — IOHEXOL 300 MG/ML  SOLN
100.0000 mL | Freq: Once | INTRAMUSCULAR | Status: AC | PRN
  Administered 2022-01-21: 50 mL via INTRAVENOUS

## 2022-01-21 MED ORDER — FENTANYL CITRATE (PF) 100 MCG/2ML IJ SOLN
INTRAMUSCULAR | Status: AC | PRN
  Administered 2022-01-21 (×4): 25 ug via INTRAVENOUS

## 2022-01-21 MED ORDER — MIDAZOLAM HCL 2 MG/2ML IJ SOLN
INTRAMUSCULAR | Status: AC
  Filled 2022-01-21: qty 2

## 2022-01-21 MED ORDER — SODIUM CHLORIDE 0.9 % IV SOLN: INTRAVENOUS | Status: DC

## 2022-01-21 MED ORDER — LIDOCAINE HCL 1 % IJ SOLN
INTRAMUSCULAR | Status: AC
  Administered 2022-01-21: 10 mL
  Filled 2022-01-21: qty 20

## 2022-01-21 MED ORDER — FENTANYL CITRATE (PF) 100 MCG/2ML IJ SOLN
INTRAMUSCULAR | Status: AC
  Filled 2022-01-21: qty 2

## 2022-01-21 MED ORDER — MIDAZOLAM HCL 2 MG/2ML IJ SOLN
INTRAMUSCULAR | Status: AC | PRN
  Administered 2022-01-21 (×2): .5 mg via INTRAVENOUS

## 2022-01-21 NOTE — H&P (Signed)
Chief Complaint: Decrease flow and pain tin he arm when accessed. Request is for fistulogram with possible intervention -  left brachiopcephalic fistula  Referring Physician(s): Siloam B  Supervising Physician: Corrie Mckusick  Patient Status: [facility] - Out-pt  History of Present Illness: Logan [name] is a 51 y.o. male panish speaking male outpatient. History of HTN, Hep C, GERD, Anemia, ESRD on HD via  a left brachiopcephalic fistula s/p revision and plication on 3.82.50, An angioplasty performed on 8.23.22 by vascular surgery with dilation to 2 separate areas with a 12 mm balloon. 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 Of note the Patient has a prior left lower AVF that is not functional. .Team is requesting fistulogram due to decrease flow and pain in the arm when accessed.   Patient seen in [place] today. Patient laying in bed, not in acute distress. Interpreter at bedside.  Patient states that the fistula is functioning and he received full HD session yesterday, but there has been significant pain when the fistula is being used.  He reports HA, related it due to being NPO.   Denise  fever, chills, shortness of breath, cough, chest pain, abdominal pain, nausea ,vomiting, and bleeding.    Past Medical History:  Diagnosis Date   Anemia    Anemia    pt denies this   Anginal pain ([place])    Chronic hepatitis    Chronic kidney disease    Complication of anesthesia    Dialysis patient ([place])    MONDAY, WEDNESDAY, FRIDAY   Dysrhythmia    End stage renal disease ([place])    dialysis [facility]   GERD (gastroesophageal reflux disease)    diet control no meds   History of hepatitis C    History of hypoparathyroidism    pt denies this   Hypertension    Hypertension    pt denies this   PONV (postoperative nausea and vomiting)    Renal failure     Past Surgical History:  Procedure Laterality Date   AV FISTULA PLACEMENT, BRACHIOCEPHALIC  53/97/67   Left arm by  Dr. Donnetta Hutching   FISTULA SUPERFICIALIZATION Left 06/16/1935   Procedure: PLICATION OF LEFT BRACHIOCEPHALIC FISTULA ANEURYSM;  Surgeon: Serafina Mitchell, MD;  Location: [facility];  Service: Vascular;  Laterality: Left;   PARATHYROIDECTOMY  01/14/11   PERIPHERAL VASCULAR BALLOON ANGIOPLASTY Left 12/04/2020   Procedure: PERIPHERAL VASCULAR BALLOON ANGIOPLASTY;  Surgeon: Serafina Mitchell, MD;  Location: [facility];  Service: Cardiovascular;  Laterality: Left;  arm fistula   REVISON OF ARTERIOVENOUS FISTULA Left 05/11/2014   Procedure: RESECTION AND PLICATION OF ANEURSYM LEFT UPPER ARM FISTULA TIMES TWO;  Surgeon: Serafina Mitchell, MD;  Location: [place];  Service: Vascular;  Laterality: Left;    Allergies: Patient has no known allergies.  Medications: Prior to Admission medications   Medication Sig Start Date End Date Taking? Authorizing Provider  acetaminophen (TYLENOL) 500 MG tablet Take 500 mg by mouth every 6 (six) hours as needed for moderate pain.    [provider]  calcium elemental as carbonate (TUMS ULTRA 1000) 400 MG chewable tablet Chew 5,000 mg by mouth 2 (two) times daily with a meal.    [provider]  cyproheptadine (PERIACTIN) 4 MG tablet TAKE 1 TABLET BY MOUTH THREE TIMES DAILY AS NEEDED FOR ITCHING. Patient not taking: No sig reported 03/21/20 03/21/21  Mauricia Area, MD  ethyl chloride spray Apply 1 application topically every Monday, Wednesday, and  Friday. 11/28/20   [provider]  oxyCODONE-acetaminophen (PERCOCET) 5-325 MG tablet Take 1 tablet by mouth every 6 (six) hours as needed for severe pain. 12/13/20   Gabriel Earing, PA-C     History reviewed. No pertinent family history.  Social History   Socioeconomic History   Marital status: Married    Spouse name: Not on file   Number of children: Not on file   Years of education: Not on file   Highest education level: Not on file  Occupational History   Not on file  Tobacco Use   Smoking  status: Former    Packs/day: 1.00    Years: 3.00    Total pack years: 3.00    Types: Cigarettes    Quit date: 11/19/1995    Years since quitting: 26.1   Smokeless tobacco: Never   Tobacco comments:    quit 7 yrs ago  Vaping Use   Vaping Use: Never used  Substance and Sexual Activity   Alcohol use: Not Currently    Comment: occasional beer   Drug use: No   Sexual activity: Yes  Other Topics Concern   Not on file  Social History Narrative   ** Merged History Encounter **       Social Determinants of Health   Financial Resource Strain: Not on file  Food Insecurity: Not on file  Transportation Needs: Not on file  Physical Activity: Not on file  Stress: Not on file  Social Connections: Not on file     Review of Systems: A 12 point ROS discussed and pertinent positives are indicated in the HPI above.  All other systems are negative.    Vital Signs: BP (!) 169/97   Pulse 69   Temp 97.8 F (36.6 C) (Oral)   Resp 18   Ht 5\' 5"  (1.651 m)   Wt 165 lb 5.5 oz (75 kg)   SpO2 98%   BMI 27.51 kg/m     Physical Exam Vitals reviewed.  Constitutional:      General: He is not in acute distress.    Appearance: Normal appearance. He is not ill-appearing.  HENT:     Head: Normocephalic.     Mouth/Throat:     Mouth: Mucous membranes are moist.     Pharynx: Oropharynx is clear.  Cardiovascular:     Rate and Rhythm: Normal rate and regular rhythm.     Heart sounds: Normal heart sounds.  Pulmonary:     Effort: Pulmonary effort is normal.     Breath sounds: Normal breath sounds.  Abdominal:     General: Abdomen is flat. Bowel sounds are normal.     Palpations: Abdomen is soft.  Skin:    General: Skin is warm and dry.     Comments: + LUE AVF.   AVF is very prominent, had good thrill and bruit.  Moderate discoloration of the skin around the proximal lateral fistula, patient states that his skin has been like that for a while. No increased warmth, no TTP.   Neurological:      Mental Status: He is alert and oriented to person, place, and time.  Psychiatric:        Mood and Affect: Mood normal.        Behavior: Behavior normal.        Judgment: Judgment normal.     Imaging: No results found.  Labs:  CBC: No results for input(s): "WBC", "HGB", "HCT", "PLT" in the last 8760 hours.  COAGS: No results for input(s): "INR", "APTT" in the last 8760 hours.  BMP: No results for input(s): "NA", "K", "CL", "CO2", "GLUCOSE", "BUN", "CALCIUM", "CREATININE", "GFRNONAA", "GFRAA" in the last 8760 hours.  Invalid input(s): "CMP"  LIVER FUNCTION TESTS: No results for input(s): "BILITOT", "AST", "ALT", "ALKPHOS", "PROT", "ALBUMIN" in the last 8760 hours.  TUMOR MARKERS: No results for input(s): "AFPTM", "CEA", "CA199", "CHROMGRNA" in the last 8760 hours.  Assessment and Plan:  51 y.o. Spanish speaking male outpatient. History of HTN, Hep C, GERD, Anemia, ESRD on HD via  a left brachiopcephalic fistula s/p revision and plication on 8.58.85, An angioplasty performed on 8.23.22 by vascular surgery with dilation to 2 separate ares with a 12 mm balloon. 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 Of note the  Patient has a prior left lower AVF that is not functional. .Team is requesting fistulogram due to decrease flow and pain in the arm when accessed.   No recent labs. All medications are within acceptable parameters. NKDA. Patient has been NPO since midnight.  BMP with hypocalcemia 7.7, stable otherwise. - Dr. Earleen Newport notified, pt asymptomatic.   Risks and benefits discussed with the patient including, but not limited to bleeding, infection, vascular injury, pulmonary embolism, need for tunneled HD catheter placement or even death.  All of the patient's questions were answered, patient is agreeable to proceed. Consent signed and in chart.   Thank you for this interesting consult.  I greatly enjoyed meeting Logan [name] and look forward to participating  in their care.  A copy of this report was sent to the requesting provider on this date.  Electronically Signed: Tera Mater, PA-C 01/21/2022, 10:49 AM   I spent a total of  30 Minutes   in face to face in clinical consultation, greater than 50% of which was counseling/coordinating care for fistulogoram with possible intervention.

## 2022-01-21 NOTE — Procedures (Signed)
Interventional Radiology Procedure Note  Procedure:   US guided access LUE fistula, brachiocephalic Fistulagram Treatment of outflow stenosis, with 25mm, 3mm, 69mm (DEB) at the cephalic arch inflow.  Findings: Narrowing treated to <30% residual of native vein. Excellent thrill on completion. Decompressed aneurysm afterwards No narrowing of the AV anastamosis .  Complications: None Recommendations:  - Ok to use - Do not submerge for 7 days - Routine care  - DC 1 hr  Signed,  Dulcy Fanny. Earleen Newport, DO

## 2022-01-21 NOTE — Progress Notes (Signed)
Patient was given discharge instructions. He verbalized understanding. 

## 2022-01-21 NOTE — Sedation Documentation (Signed)
Interpreter information: Francisca December [phone]

## 2022-01-27 ENCOUNTER — Encounter (HOSPITAL_COMMUNITY): Payer: Self-pay | Admitting: Emergency Medicine

## 2022-01-27 ENCOUNTER — Emergency Department (HOSPITAL_COMMUNITY): Payer: Self-pay

## 2022-01-27 ENCOUNTER — Emergency Department (HOSPITAL_COMMUNITY)
Admission: EM | Admit: 2022-01-27 | Discharge: 2022-01-28 | Disposition: A | Discharge status: Home / Self Care | Payer: Self-pay | Attending: Emergency Medicine | Admitting: Emergency Medicine

## 2022-01-27 DIAGNOSIS — Z992 Dependence on renal dialysis: Secondary | ICD-10-CM | POA: Insufficient documentation

## 2022-01-27 DIAGNOSIS — N186 End stage renal disease: Secondary | ICD-10-CM | POA: Insufficient documentation

## 2022-01-27 DIAGNOSIS — I120 Hypertensive chronic kidney disease with stage 5 chronic kidney disease or end stage renal disease: Secondary | ICD-10-CM | POA: Insufficient documentation

## 2022-01-27 DIAGNOSIS — R519 Headache, unspecified: Secondary | ICD-10-CM

## 2022-01-27 DIAGNOSIS — I10 Essential (primary) hypertension: Secondary | ICD-10-CM

## 2022-01-27 NOTE — ED Provider Triage Note (Signed)
Emergency Medicine Provider Triage Evaluation Note  Logan [name] , a 51 y.o. male  was evaluated in triage.  Pt complains of headache and nausea.  Patient coming from dialysis.  Said he only had a little bit of dialysis and did not complete it..  Was given Tylenol which did not improve his symptoms.  He states that he is having headaches daily and is concerned that his blood pressure is high.  Also reports nausea without vomiting and continues to be uneasy on his stomach.  Review of Systems  Positive:  Negative: See above  Physical Exam  BP (!) 159/96 (BP Location: Right Arm)   Pulse 66   Temp (!) 97.4 F (36.3 C) (Oral)   Resp (!) 24   SpO2 100%  Gen:   Awake, no distress   Resp:  Normal effort  MSK:   Moves extremities without difficulty  Other:    Medical Decision Making  Medically screening exam initiated at 2:25 PM.  Appropriate orders placed.  Logan [name] was informed that the remainder of the evaluation will be completed by another provider, this initial triage assessment does not replace that evaluation, and the importance of remaining in the ED until their evaluation is complete.     Mickie Hillier, PA-C 01/27/22 1426

## 2022-01-27 NOTE — ED Triage Notes (Signed)
Patient BIB [facility] from dialysis for evaluation of headache and nausea that started during dialysis treatment. Reports no improvement in pain after tylenol. Patient is alert, oriented, and in no apparent distress, VSS with EMS. 4mg  zofran given by EMS PTA. 18g saline lock in right AC.

## 2022-01-28 LAB — CBC WITH DIFFERENTIAL/PLATELET
Abs Immature Granulocytes: 0.01 10*3/uL (ref 0.00–0.07)
Basophils Absolute: 0 10*3/uL (ref 0.0–0.1)
Basophils Relative: 0 %
Eosinophils Absolute: 0.4 10*3/uL (ref 0.0–0.5)
Eosinophils Relative: 8 %
HCT: 36.1 % — ABNORMAL LOW (ref 39.0–52.0)
Hemoglobin: 12.5 g/dL — ABNORMAL LOW (ref 13.0–17.0)
Immature Granulocytes: 0 %
Lymphocytes Relative: 18 %
Lymphs Abs: 0.9 10*3/uL (ref 0.7–4.0)
MCH: 33.5 pg (ref 26.0–34.0)
MCHC: 34.6 g/dL (ref 30.0–36.0)
MCV: 96.8 fL (ref 80.0–100.0)
Monocytes Absolute: 0.3 10*3/uL (ref 0.1–1.0)
Monocytes Relative: 6 %
Neutro Abs: 3.6 10*3/uL (ref 1.7–7.7)
Neutrophils Relative %: 68 %
Platelets: 118 10*3/uL — ABNORMAL LOW (ref 150–400)
RBC: 3.73 MIL/uL — ABNORMAL LOW (ref 4.22–5.81)
RDW: 12.6 % (ref 11.5–15.5)
WBC: 5.2 10*3/uL (ref 4.0–10.5)
nRBC: 0 % (ref 0.0–0.2)

## 2022-01-28 LAB — COMPREHENSIVE METABOLIC PANEL
ALT: 9 U/L (ref 0–44)
AST: 9 U/L — ABNORMAL LOW (ref 15–41)
Albumin: 3.3 g/dL — ABNORMAL LOW (ref 3.5–5.0)
Alkaline Phosphatase: 38 U/L (ref 38–126)
Anion gap: 10 (ref 5–15)
BUN: 77 mg/dL — ABNORMAL HIGH (ref 6–20)
CO2: 27 mmol/L (ref 22–32)
Calcium: 6.8 mg/dL — ABNORMAL LOW (ref 8.9–10.3)
Chloride: 104 mmol/L (ref 98–111)
Creatinine, Ser: 13.62 mg/dL — ABNORMAL HIGH (ref 0.61–1.24)
GFR, Estimated: 4 mL/min — ABNORMAL LOW (ref >60)
Glucose, Bld: 97 mg/dL (ref 70–99)
Potassium: 4.2 mmol/L (ref 3.5–5.1)
Sodium: 141 mmol/L (ref 135–145)
Total Bilirubin: 0.8 mg/dL (ref 0.3–1.2)
Total Protein: 6.3 g/dL — ABNORMAL LOW (ref 6.5–8.1)

## 2022-01-28 LAB — CBG MONITORING, ED: Glucose-Capillary: 93 mg/dL (ref 70–99)

## 2022-01-28 MED ORDER — ACETAMINOPHEN 325 MG PO TABS: 650.0000 mg | ORAL_TABLET | Freq: Four times a day (QID) | ORAL | 0 refills | Status: AC | PRN

## 2022-01-28 MED ORDER — AMLODIPINE BESYLATE 5 MG PO TABS
5.0000 mg | ORAL_TABLET | Freq: Once | ORAL | Status: AC
  Administered 2022-01-28: 5 mg via ORAL
  Filled 2022-01-28: qty 1

## 2022-01-28 MED ORDER — ACETAMINOPHEN 325 MG PO TABS: 650.0000 mg | ORAL_TABLET | Freq: Four times a day (QID) | ORAL | 0 refills | Status: DC | PRN

## 2022-01-28 NOTE — Discharge Instructions (Signed)
You were seen in the emergency department for your headache and high blood pressure.  You had a normal head scan here and your labs are unchanged from your prior.  It is unclear what was causing your symptoms yesterday but may have been due to the fluid shifts during dialysis.  You should follow-up with your primary doctor in the next few days to have your symptoms and blood pressure rechecked and you should follow-up with your nephrologist if you are having these recurrent symptoms to see if any changes need to be made during dialysis.  You can get a blood pressure cuff at any pharmacy and measure your blood pressure at home.  I recommend measuring it at the same time every day and making sure you have been resting for at least 15 minutes prior to taking your blood pressure.  You should continue to take your blood pressure medication as prescribed and you can take Tylenol as needed for headaches.  You should return to the emergency department if you have significantly worsening headache, repetitive vomiting, numbness or weakness on one side of the body compared to the other, you pass out or if you have any other new or concerning symptoms.  Lo atendieron en el departamento de emergencias por su dolor de cabeza y presin arterial alta. Le hicieron un escaneo de cabeza normal aqu y sus anlisis no han cambiado con [place] a los [place]. No est claro qu estaba causando sus sntomas ayer, pero puede deberse a los cambios de lquido durante la dilisis. Debe realizar un seguimiento con su mdico de atencin primaria en los prximos das para que le vuelvan a Chief Technology Officer los sntomas y la presin arterial y Production manager un seguimiento con su nefrlogo si tiene estos sntomas recurrentes para ver si es necesario realizar algn cambio durante la dilisis. Puede conseguir un manguito de presin arterial en cualquier farmacia y medir su presin arterial en casa. Recomiendo medirla a la [employer] y  asegurarse de Investment banker, corporate en reposo al menos 15 minutos antes de tomarse la presin arterial. Debe continuar tomando sus medicamentos para la presin arterial segn lo recetado y puede tomar Tylenol segn sea necesario para los dolores de [place]. Debe regresar al departamento de emergencias si su dolor de cabeza empeora significativamente, vmitos repetitivos, entumecimiento o debilidad en un lado del cuerpo en comparacin con el otro, se desmaya o si tiene algn otro sntoma nuevo o preocupante.

## 2022-01-28 NOTE — ED Provider Notes (Signed)
[facility] Provider Note   CSN: [id number] Arrival date & time: 01/27/22  1411     History  Chief Complaint  Patient presents with   Headache    Logan [name] is a 51 y.o. male.  Patient is a 51 year old Spanish-speaking male with a past medical history of ESRD on MWF HD, hypertension presenting to the emergency department with headache.  Patient reports he was receiving his regular to this session yesterday when he started to develop a diffuse frontal headache.  He states that his blood pressure was checked at that time and was noted to be in the 180s.  He states that he felt nauseous and vomited.  He states that his dialysis session was stopped early and he was transported to the ER.  He was given nausea medication by medics and states that his nausea resolved after that but continued to have a headache for several hours while in the waiting room.  He denies any fevers or chills, neck pain or stiffness, vision changes, numbness or weakness.  He denies any associated chest or abdominal pain.  States that this is happened before during dialysis but is unsure of the cause.  Patient was in the waiting room for 18 hours prior to being seen and states that he is currently asymptomatic at this time.  He states that he did take his blood pressure medicine yesterday before dialysis but has not yet taken his morning medications today.  The history is provided by the patient. A language interpreter was used [place]).  Headache      Home Medications Prior to Admission medications   Medication Sig Start Date End Date Taking? Authorizing Provider  acetaminophen (TYLENOL) 325 MG tablet Take 2 tablets (650 mg total) by mouth every 6 (six) hours as needed. 01/28/22  Yes Maylon Peppers, Jordan Hawks K, DO  calcium elemental as carbonate (TUMS ULTRA 1000) 400 MG chewable tablet Chew 5,000 mg by mouth 2 (two) times daily with a meal.    [provider]  cyproheptadine (PERIACTIN) 4 MG tablet TAKE 1 TABLET BY MOUTH THREE TIMES DAILY AS NEEDED FOR ITCHING. 03/21/20 01/21/22  Mauricia Area, MD  ethyl chloride spray Apply 1 application topically every Monday, Wednesday, and Friday. 11/28/20   [provider]  oxyCODONE-acetaminophen (PERCOCET) 5-325 MG tablet Take 1 tablet by mouth every 6 (six) hours as needed for severe pain. 12/13/20   Gabriel Earing, PA-C      Allergies    Patient has no known allergies.    Review of Systems   Review of Systems  Neurological:  Positive for headaches.    Physical Exam Updated Vital Signs BP (!) 145/90   Pulse 70   Temp (!) 97.5 F (36.4 C) (Oral)   Resp 13   SpO2 98%  Physical Exam Vitals and nursing note reviewed.  Constitutional:      General: He is not in acute distress.    Appearance: He is well-developed.  HENT:     Head: Normocephalic and atraumatic.     Mouth/Throat:     Pharynx: Oropharynx is clear.  Eyes:     General: No visual field deficit.    Extraocular Movements: Extraocular movements intact.     Pupils: Pupils are equal, round, and reactive to light.  Cardiovascular:     Rate and Rhythm: Normal rate and regular rhythm.     Heart sounds: Normal heart sounds.  Pulmonary:     Effort: Pulmonary effort  is normal.     Breath sounds: Normal breath sounds.  Abdominal:     Palpations: Abdomen is soft.     Tenderness: There is no abdominal tenderness.  Musculoskeletal:        General: No swelling. Normal range of motion.     Cervical back: Normal range of motion and neck supple. No rigidity.     Comments: LUE fistula with palpable thrill  Skin:    General: Skin is warm and dry.  Neurological:     Mental Status: He is alert and oriented to person, place, and time.     GCS: GCS eye subscore is 4. GCS verbal subscore is 5. GCS motor subscore is 6.     Cranial Nerves: No cranial nerve deficit, dysarthria or facial asymmetry.     Sensory: No sensory  deficit.     Motor: No weakness.     Coordination: Coordination normal.  Psychiatric:        Mood and Affect: Mood normal.        Behavior: Behavior normal.     ED Results / Procedures / Treatments   Labs (all labs ordered are listed, but only abnormal results are displayed) Labs Reviewed  COMPREHENSIVE METABOLIC PANEL - Abnormal; Notable for the following components:      Result Value   BUN 77 (*)    Creatinine, Ser 13.62 (*)    Calcium 6.8 (*)    Total Protein 6.3 (*)    Albumin 3.3 (*)    AST 9 (*)    GFR, Estimated 4 (*)    All other components within normal limits  CBC WITH DIFFERENTIAL/PLATELET - Abnormal; Notable for the following components:   RBC 3.73 (*)    Hemoglobin 12.5 (*)    HCT 36.1 (*)    Platelets 118 (*)    All other components within normal limits  CBG MONITORING, ED    EKG EKG Interpretation  Date/Time:  Tuesday January 28 2022 10:11:00 EDT Ventricular Rate:  68 PR Interval:  149 QRS Duration: 93 QT Interval:  454 QTC Calculation: 483 R Axis:   -59 Text Interpretation: Sinus rhythm Left anterior fascicular block Abnormal R-wave progression, late transition Borderline T abnormalities, inferior leads Borderline prolonged QT interval No significant change since last tracing Confirmed by Leanord Asal ([id number]) on 01/28/2022 10:17:30 AM  Radiology CT Head Wo Contrast  Result Date: 01/27/2022 CLINICAL DATA:  Headache, chronic, new features or increased frequency. EXAM: CT HEAD WITHOUT CONTRAST TECHNIQUE: Contiguous axial images were obtained from the base of the skull through the vertex without intravenous contrast. RADIATION DOSE REDUCTION: This exam was performed according to the departmental dose-optimization program which includes automated exposure control, adjustment of the mA and/or kV according to patient size and/or use of iterative reconstruction technique. COMPARISON:  None Available. FINDINGS: Brain: Mild atrophy considering age. No  evidence of old or acute focal infarction, mass lesion, hemorrhage, hydrocephalus or extra-axial collection. Vascular: Regional extracranial arterial calcification consistent with chronic diabetes. Skull: Normal Sinuses/Orbits: Clear sinuses. Old minor medial orbital blowout fracture on the right. Other: None IMPRESSION: 1. No acute finding. Mild atrophy considering age. 2. Regional extracranial arterial calcification consistent with chronic diabetes Electronically Signed   By: Nelson Chimes M.D.   On: 01/27/2022 15:27    Procedures Procedures    Medications Ordered in ED Medications  amLODipine (NORVASC) tablet 5 mg (5 mg Oral Given 01/28/22 0955)    ED Course/ Medical Decision Making/ A&P Clinical Course as of 01/28/22  Mayer Jan 28, 2022  1153 Upon reassessment, the patient remains headache free and blood pressures have improved to the 416L systolic.  His labs are at his baseline.  He is stable for discharge with outpatient primary care and nephrology follow-up.  He was given strict return precautions. [VK]    Clinical Course User Index [VK] Kemper Durie, DO                           Medical Decision Making This patient presents to the ED with chief complaint(s) of headache with pertinent past medical history of ESRD on MWF HD, HTN which further complicates the presenting complaint. The complaint involves an extensive differential diagnosis and also carries with it a high risk of complications and morbidity.    The differential diagnosis includes ICH, mass effect, hypertensive emergency, hypertensive urgency, hypertensive headache, electrolyte abnormality, hypo or hyperglycemia, patient has had no fevers and no meningismus making meningitis or viral syndrome unlikely, he has no abdominal pain and is nontender making intra-abdominal infection unlikely  Additional history obtained: Additional history obtained from EMS  Records reviewed previous vascular records with recent  fistulagram   ED Course and Reassessment: Patient was initially evaluated by provider in triage and ordered labs and head CT.  Head CT was performed that showed no acute intracranial abnormality.  Labs have not yet been ran.  The patient is now asymptomatic and hypertensive with a blood pressure in the 180s over 100s.  He will be given his home amlodipine.  We will send labs and reassess. He does not appear volume overloaded or overtly in need of emergent dialysis but will assess electrolytes.  Independent labs interpretation:  The following labs were independently interpreted: within normal range of patient's baseline  Independent visualization of imaging: - I independently visualized the following imaging with scope of interpretation limited to determining acute life threatening conditions related to emergency care: CTH, which revealed no acute disease  Consultation: - Consulted or discussed management/test interpretation w/ external professional: N/A  Consideration for admission or further workup: Patient has no emergent conditions requiring admission at this time and is stable for discharge with primary care and nephrology follow-up Social Determinants of health: N/A    Risk OTC drugs. Prescription drug management.          Final Clinical Impression(s) / ED Diagnoses Final diagnoses:  Hypertension, unspecified type  Acute nonintractable headache, unspecified headache type    Rx / DC Orders ED Discharge Orders          Ordered    acetaminophen (TYLENOL) 325 MG tablet  Every 6 hours PRN        01/28/22 1156              Kemper Durie, DO 01/28/22 1156

## 2022-04-27 ENCOUNTER — Ambulatory Visit (HOSPITAL_COMMUNITY): Payer: Self-pay

## 2022-05-12 ENCOUNTER — Other Ambulatory Visit (HOSPITAL_COMMUNITY): Payer: Self-pay

## 2022-05-13 ENCOUNTER — Other Ambulatory Visit: Payer: Self-pay

## 2022-05-13 ENCOUNTER — Ambulatory Visit (INDEPENDENT_AMBULATORY_CARE_PROVIDER_SITE_OTHER): Payer: Self-pay | Admitting: Internal Medicine

## 2022-05-13 ENCOUNTER — Encounter: Payer: Self-pay | Admitting: Internal Medicine

## 2022-05-13 VITALS — BP 148/90 | HR 65 | Temp 98.1°F | Wt 158.0 lb

## 2022-05-13 DIAGNOSIS — B182 Chronic viral hepatitis C: Secondary | ICD-10-CM

## 2022-05-13 NOTE — Progress Notes (Signed)
[facility] for Infectious Disease      Reason for Consult: positive hepatitis C Ab    Referring Physician: Dr. Joelyn Oms    Patient ID: Logan [name], male    DOB: 1970-07-06, 52 y.o.   MRN: [id number]  HPI:   Logan [name] is here for evaluation of chronic hepatitis C He has a history of hepatitis C, genotype 3a and previously treated by Dr. Edwyna Ready over 5 years ago, from his recolllection.  He remembers completing 12 weeks of pills and told he was cured.  He has not followed up with that clinic since that time. He has had negative viral loads x 2 in January 2023.  No current risk factors for reinfection.  No recent viral RNA checked.  Here for confirmation of cure.  Normal liver on CT scan in 2017.   Past Medical History:  Diagnosis Date   Anemia    Anemia    pt denies this   Anginal pain ([place])    Chronic hepatitis    Chronic kidney disease    Complication of anesthesia    Dialysis patient ([facility])    MONDAY, WEDNESDAY, FRIDAY   Dysrhythmia    End stage renal disease ([place])    dialysis M W F   GERD (gastroesophageal reflux disease)    diet control no meds   History of hepatitis C    History of hypoparathyroidism    pt denies this   Hypertension    Hypertension    pt denies this   PONV (postoperative nausea and vomiting)    Renal failure     Prior to Admission medications   Medication Sig Start Date End Date Taking? Authorizing Provider  acetaminophen (TYLENOL) 325 MG tablet Take 2 tablets (650 mg total) by mouth every 6 (six) hours as needed. 01/28/22   Leanord Asal K, DO  calcium elemental as carbonate (TUMS ULTRA 1000) 400 MG chewable tablet Chew 5,000 mg by mouth 2 (two) times daily with a meal.    [provider]  cyproheptadine (PERIACTIN) 4 MG tablet TAKE 1 TABLET BY MOUTH THREE TIMES DAILY AS NEEDED FOR ITCHING. 03/21/20 01/21/22  Mauricia Area, MD  ethyl chloride spray Apply 1 application topically every Monday, Wednesday, and Friday.  11/28/20   [provider]  oxyCODONE-acetaminophen (PERCOCET) 5-325 MG tablet Take 1 tablet by mouth every 6 (six) hours as needed for severe pain. 12/13/20   Rhyne, Hulen Shouts, PA-C    No Known Allergies  Social History   Tobacco Use   Smoking status: Former    Packs/day: 1.00    Years: 3.00    Total pack years: 3.00    Types: Cigarettes    Quit date: 11/19/1995    Years since quitting: 26.4   Smokeless tobacco: Never   Tobacco comments:    quit 7 yrs ago  Vaping Use   Vaping Use: Never used  Substance Use Topics   Alcohol use: Not Currently    Comment: occasional beer   Drug use: No    No family history on file.  Review of Systems  Constitutional: negative for fatigue All other systems reviewed and are negative    Constitutional: in no apparent distress  Vitals:   05/13/22 0952  BP: (!) 159/92  Pulse: 65  Temp: 98.1 F (36.7 C)  SpO2: 100%   EYES: anicteric Respiratory: normal respiratory effort Musculoskeletal: no edema Skin: no rash  Labs: Lab Results  Component Value Date   WBC 5.2 01/28/2022  HGB 12.5 (L) 01/28/2022   HCT 36.1 (L) 01/28/2022   MCV 96.8 01/28/2022   PLT 118 (L) 01/28/2022    Lab Results  Component Value Date   CREATININE 13.62 (H) 01/28/2022   BUN 77 (H) 01/28/2022   NA 141 01/28/2022   K 4.2 01/28/2022   CL 104 01/28/2022   CO2 27 01/28/2022    Lab Results  Component Value Date   ALT 9 01/28/2022   AST 9 (L) 01/28/2022   ALKPHOS 38 01/28/2022   BILITOT 0.8 01/28/2022   INR 0.97 01/14/2011     Assessment: chronic hepatitis C, s/p curative treatment. I discussed hepatitis C with him, cure and outcomes.   Appears to have been treated by Dr. Edwyna Ready around 2010 or 11.  I suspect as part of a study protocol.   Plan: 1)  will reconfirm with HCV RNA and if negative, no follow up indicated.

## 2022-05-15 LAB — HEPATITIS C RNA QUANTITATIVE
HCV Quantitative Log: <1.18 log IU/mL
HCV RNA, PCR, QN: <15 IU/mL

## 2023-05-04 ENCOUNTER — Encounter: Payer: Self-pay | Admitting: Internal Medicine

## 2023-05-04 ENCOUNTER — Ambulatory Visit: Payer: Self-pay | Attending: Internal Medicine | Admitting: Internal Medicine

## 2023-05-04 VITALS — BP 135/94 | HR 66 | Temp 97.5°F | Ht 65.0 in

## 2023-05-04 DIAGNOSIS — Z992 Dependence on renal dialysis: Secondary | ICD-10-CM

## 2023-05-04 DIAGNOSIS — I120 Hypertensive chronic kidney disease with stage 5 chronic kidney disease or end stage renal disease: Secondary | ICD-10-CM

## 2023-05-04 DIAGNOSIS — Z1211 Encounter for screening for malignant neoplasm of colon: Secondary | ICD-10-CM

## 2023-05-04 DIAGNOSIS — Z8619 Personal history of other infectious and parasitic diseases: Secondary | ICD-10-CM

## 2023-05-04 DIAGNOSIS — D7589 Other specified diseases of blood and blood-forming organs: Secondary | ICD-10-CM

## 2023-05-04 DIAGNOSIS — R040 Epistaxis: Secondary | ICD-10-CM

## 2023-05-04 DIAGNOSIS — N186 End stage renal disease: Secondary | ICD-10-CM

## 2023-05-04 DIAGNOSIS — Z7689 Persons encountering health services in other specified circumstances: Secondary | ICD-10-CM

## 2023-05-04 NOTE — Progress Notes (Signed)
Patient ID: Logan [name], male    DOB: 11-21-1970  MRN: [id number]  CC: Establish Care (Est care / new pt. /No questions / concerns/Discuss vaccines received at dialysis)   Subjective: Logan [name] is a 53 y.o. male who presents for new pt visit.  Doneen Poisson, from [facility], is with him and interprets His concerns today include:  Patient with history of HTN, hepatitis C treated and cured, secondary hyperparathyroidism, ESRD on HD, former smoker   No previous PCP. Hx of HTN, ESRD on HD.  On HD x 18-19 yrs reportedly due to kidney injury from fall.  Goes M/W/F. On Coreg 25 mg BID.  Did not take med as yet this a.m. Checks BP 1-2x/wk at home.  Gives range SBP 125. Also on TUMS, Iron and Tylenol. Using Afrin nasal spray for nose bleeds. Started having epistaxis LT side x 1.5 yrs ago. Nose bleeds associated with pain on LT side of face. Afrin nasal spray recommended by HD provider. Episodes about 2x/wk.  Last episode was this a.m. Usually not large bleed; uses cold compresses over forehead which helps to slow down the bleeding. Last 10-15 mins  Former smoker; stopped at age 60.    HM: should be due for flu, PCV and Shingrix vaccines. Gets flu shot in HD 2-3 mths ago.  He thinks he may have received the Shingrix and PCV vaccines in HD also.  Due for colon CA screening.  Never had c-scope.  No fhx colon CA.   Patient Active Problem List   Diagnosis Date Noted   Chronic hepatitis C without hepatic coma (HCC) 05/13/2022   End stage renal disease (HCC) 07/02/2011   Hyperparathyroidism, secondary (HCC) 11/26/2010     Current Outpatient Medications on File Prior to Visit  Medication Sig Dispense Refill   acetaminophen (TYLENOL) 325 MG tablet Take 2 tablets (650 mg total) by mouth every 6 (six) hours as needed. 30 tablet 0   calcium elemental as carbonate (TUMS ULTRA 1000) 400 MG chewable tablet Chew 5,000 mg by mouth 2 (two) times daily with a meal.     COREG  25 MG tablet Take by mouth.     ferric citrate (AURYXIA) 1 GM 210 MG(Fe) tablet Take 420 mg by mouth 3 (three) times daily with meals.     No current facility-administered medications on file prior to visit.    No Known Allergies  Social History   Socioeconomic History   Marital status: Married    Spouse name: Not on file   Number of children: Not on file   Years of education: Not on file   Highest education level: Not on file  Occupational History   Not on file  Tobacco Use   Smoking status: Former    Current packs/day: 0.00    Average packs/day: 1 pack/day for 3.0 years (3.0 ttl pk-yrs)    Types: Cigarettes    Start date: 11/18/1992    Quit date: 11/19/1995    Years since quitting: 27.4   Smokeless tobacco: Never   Tobacco comments:    quit 7 yrs ago  Vaping Use   Vaping status: Never Used  Substance and Sexual Activity   Alcohol use: Not Currently    Comment: occasional beer   Drug use: No   Sexual activity: Yes  Other Topics Concern   Not on file  Social History Narrative   ** Merged History Encounter **       Social Drivers of Dispensing optician  Resource Strain: Not on file  Food Insecurity: Not on file  Transportation Needs: Not on file  Physical Activity: Not on file  Stress: Not on file  Social Connections: Not on file  Intimate Partner Violence: Not on file    History reviewed. No pertinent family history.  Past Surgical History:  Procedure Laterality Date   AV FISTULA PLACEMENT, BRACHIOCEPHALIC  10/17/10   Left arm by Dr. Arbie Cookey   FISTULA SUPERFICIALIZATION Left 12/13/2020   Procedure: PLICATION OF LEFT BRACHIOCEPHALIC FISTULA ANEURYSM;  Surgeon: Nada Libman, MD;  Location: [facility] OR;  Service: Vascular;  Laterality: Left;   IR AV DIALY SHUNT INTRO NEEDLE/INTRACATH INITIAL W/PTA/IMG LEFT  01/21/2022   IR US GUIDE VASC ACCESS LEFT  01/21/2022   PARATHYROIDECTOMY  01/14/11   PERIPHERAL VASCULAR BALLOON ANGIOPLASTY Left 12/04/2020   Procedure:  PERIPHERAL VASCULAR BALLOON ANGIOPLASTY;  Surgeon: Nada Libman, MD;  Location: [facility] INVASIVE CV LAB;  Service: Cardiovascular;  Laterality: Left;  arm fistula   REVISON OF ARTERIOVENOUS FISTULA Left 05/11/2014   Procedure: RESECTION AND PLICATION OF ANEURSYM LEFT UPPER ARM FISTULA TIMES TWO;  Surgeon: Nada Libman, MD;  Location: [facility] OR;  Service: Vascular;  Laterality: Left;    ROS: Review of Systems Negative except as stated above  PHYSICAL EXAM: BP (!) 153/92 (BP Location: Left Arm, Patient Position: Sitting, Cuff Size: Normal)   Pulse 66   Temp (!) 97.5 F (36.4 C) (Oral)   Ht 5\' 5"  (1.651 m)   SpO2 98%   BMI 26.29 kg/m   Physical Exam   General appearance - alert, well appearing, middle-age Hispanic male and in no distress Mental status - normal mood, behavior, speech, dress, motor activity, and thought processes Nose - normal and patent, no erythema, discharge or polyps; no dried blood noted in LT nares Mouth - mucous membranes moist, pharynx normal without lesions Neck - supple, no significant adenopathy Chest - clear to auscultation, no wheezes, rales or rhonchi, symmetric air entry Heart - normal rate, regular rhythm, normal S1, S2, no murmurs, rubs, clicks or gallops Extremities - no LE edema.  HD graft in LUE     Latest Ref Rng & Units 01/28/2022    9:57 AM 01/21/2022   10:11 AM 12/13/2020    6:18 AM  CMP  Glucose 70 - 99 mg/dL 97  83  161   BUN 6 - 20 mg/dL 77  68  59   Creatinine 0.61 - 1.24 mg/dL 09.60  45.40  98.11   Sodium 135 - 145 mmol/L 141  142  140   Potassium 3.5 - 5.1 mmol/L 4.2  5.0  4.1   Chloride 98 - 111 mmol/L 104  100  95   CO2 22 - 32 mmol/L 27  26    Calcium 8.9 - 10.3 mg/dL 6.8  7.7    Total Protein 6.5 - 8.1 g/dL 6.3     Total Bilirubin 0.3 - 1.2 mg/dL 0.8     Alkaline Phos 38 - 126 U/L 38     AST 15 - 41 U/L 9     ALT 0 - 44 U/L 9      Lipid Panel  No results found for: "CHOL", "TRIG", "HDL", "CHOLHDL", "VLDL", "LDLCALC",  "LDLDIRECT"  CBC    Component Value Date/Time   WBC 5.2 01/28/2022 0957   RBC 3.73 (L) 01/28/2022 0957   HGB 12.5 (L) 01/28/2022 0957   HCT 36.1 (L) 01/28/2022 0957   PLT 118 (L) 01/28/2022 0957  MCV 96.8 01/28/2022 0957   MCH 33.5 01/28/2022 0957   MCHC 34.6 01/28/2022 0957   RDW 12.6 01/28/2022 0957   LYMPHSABS 0.9 01/28/2022 0957   MONOABS 0.3 01/28/2022 0957   EOSABS 0.4 01/28/2022 0957   BASOSABS 0.0 01/28/2022 0957    ASSESSMENT AND PLAN:  1. Establishing care with new doctor, encounter for (Primary)   2. ESRD on hemodialysis (HCC) Pt going to HD M/W/F - CBC - Comprehensive metabolic panel  3. Hypertensive CKD, ESRD on dialysis (HCC) Not at goal but blood pressure improved on repeat check.  He has not taken carvedilol as yet for the morning.  Advised to do so when he returns home.  Continue carvedilol 25 mg twice a day - Lipid panel  4. Epistaxis We should get him to ENT as soon as possible.  He was given forms to apply for the orange card/cone discount card.  Advised patient to pinch the nose and hold the head back for about 5 minutes whenever he has a nosebleed.  If he has a big nosebleed where blood is draining down the back of the throat, he should be seen in the emergency room - Ambulatory referral to ENT - CBC  5. Hepatitis C virus infection cured after antiviral drug therapy   6. Screening for colon cancer - Fecal occult blood, imunochemical([facility])  Advised patient to check with one of the nurses at dialysis and have them write down the names and dates of vaccines that he has received there.  I suspect he received the flu vaccine every year but doubt he has received the pneumonia or shingles vaccine.  Patient was given the opportunity to ask questions.  Patient verbalized understanding of the plan and was able to repeat key elements of the plan.   This documentation was completed using Paediatric nurse.  Any transcriptional  errors are unintentional.  No orders of the defined types were placed in this encounter.    Requested Prescriptions    No prescriptions requested or ordered in this encounter    No follow-ups on file.  Jonah Blue, MD, FACP

## 2023-05-05 ENCOUNTER — Telehealth: Payer: Self-pay | Admitting: Internal Medicine

## 2023-05-05 LAB — COMPREHENSIVE METABOLIC PANEL
ALT: 9 [IU]/L (ref 0–44)
AST: 10 [IU]/L (ref 0–40)
Albumin: 4.6 g/dL (ref 4.1–5.1)
Alkaline Phosphatase: 75 [IU]/L (ref 44–121)
BUN/Creatinine Ratio: 7 — ABNORMAL LOW (ref 9–20)
BUN: 105 mg/dL (ref 6–24)
Bilirubin Total: 0.5 mg/dL (ref 0.0–1.2)
CO2: 20 mmol/L (ref 20–29)
Calcium: 7.8 mg/dL — ABNORMAL LOW (ref 8.7–10.2)
Chloride: 99 mmol/L (ref 96–106)
Creatinine, Ser: 14.51 mg/dL — ABNORMAL HIGH (ref 0.76–1.27)
Globulin, Total: 3.2 g/dL (ref 1.5–4.5)
Glucose: 90 mg/dL (ref 70–99)
Potassium: 6.3 mmol/L — ABNORMAL HIGH (ref 3.5–5.2)
Sodium: 146 mmol/L — ABNORMAL HIGH (ref 134–144)
Total Protein: 7.8 g/dL (ref 6.0–8.5)
eGFR: 4 mL/min/{1.73_m2} — ABNORMAL LOW (ref >59)

## 2023-05-05 LAB — LIPID PANEL
Chol/HDL Ratio: 5 {ratio} (ref 0.0–5.0)
Cholesterol, Total: 134 mg/dL (ref 100–199)
HDL: 27 mg/dL — ABNORMAL LOW (ref >39)
LDL Chol Calc (NIH): 71 mg/dL (ref 0–99)
Triglycerides: 214 mg/dL — ABNORMAL HIGH (ref 0–149)
VLDL Cholesterol Cal: 36 mg/dL (ref 5–40)

## 2023-05-05 LAB — CBC
Hematocrit: 43.7 % (ref 37.5–51.0)
Hemoglobin: 14.3 g/dL (ref 13.0–17.7)
MCH: 33.3 pg — ABNORMAL HIGH (ref 26.6–33.0)
MCHC: 32.7 g/dL (ref 31.5–35.7)
MCV: 102 fL — ABNORMAL HIGH (ref 79–97)
Platelets: 125 10*3/uL — ABNORMAL LOW (ref 150–450)
RBC: 4.29 x10E6/uL (ref 4.14–5.80)
RDW: 16 % — ABNORMAL HIGH (ref 11.6–15.4)
WBC: 6.4 10*3/uL (ref 3.4–10.8)

## 2023-05-05 NOTE — Addendum Note (Signed)
Addended by: Jonah Blue B on: 05/05/2023 08:54 AM   Modules accepted: Orders

## 2023-05-05 NOTE — Telephone Encounter (Signed)
Called & spoke to the patient. Verified name & DOB. Informed of results. Informed of results & recommendations. Advised patient to go to the ER. Patient stated that he saw his dialysis provider who also informed him that potassium level was elevated but only needs to make dietary changes. Patient would like further clarification if he still needs to go to ER. Please advise.

## 2023-05-05 NOTE — Telephone Encounter (Signed)
Let patient know that his potassium level is elevated.  High potassium can cause his heart to go into abnormal rhythm.  He should be seen in the emergency room for this. -Mild elevation in triglyceride level.  Healthy eating habits will help to lower triglyceride. -Blood cell count looks okay except platelet cell counts are slightly low.  These are the blood cell counts that help the blood clot normally. Liver function tests so far looks okay.

## 2023-05-06 ENCOUNTER — Other Ambulatory Visit: Payer: Self-pay | Admitting: Internal Medicine

## 2023-05-06 LAB — B12 AND FOLATE PANEL
Folate: 7.6 ng/mL (ref >3.0)
Vitamin B-12: 396 pg/mL (ref 232–1245)

## 2023-05-06 LAB — SPECIMEN STATUS REPORT

## 2023-05-06 NOTE — Telephone Encounter (Signed)
Patient goes to dialysis on Monday Wednesday and Fridays.  I am sure they will recheck his potassium level during dialysis and address.  He should let them know nonetheless that his potassium level was elevated when we checked it

## 2023-05-06 NOTE — Telephone Encounter (Signed)
Lowry Ram & spoke to the patient. Verified name & DOB. Advised patient to inform dialysis provider to recheck his potassium level during his next visit since it was elevated when we checked it. Patient expressed verbal understanding. No further questions at this time.

## 2023-06-03 ENCOUNTER — Telehealth (INDEPENDENT_AMBULATORY_CARE_PROVIDER_SITE_OTHER): Payer: Self-pay | Admitting: Otolaryngology

## 2023-06-03 NOTE — Telephone Encounter (Signed)
 LVM via interpreter with appt date, time and address

## 2023-06-04 ENCOUNTER — Encounter (INDEPENDENT_AMBULATORY_CARE_PROVIDER_SITE_OTHER): Payer: Self-pay

## 2023-06-04 ENCOUNTER — Ambulatory Visit (INDEPENDENT_AMBULATORY_CARE_PROVIDER_SITE_OTHER): Payer: Self-pay | Admitting: Otolaryngology

## 2023-06-04 VITALS — BP 102/65 | HR 68

## 2023-06-04 DIAGNOSIS — R040 Epistaxis: Secondary | ICD-10-CM

## 2023-06-04 MED ORDER — AYR SALINE NASAL NA GEL: 1.0000 | Freq: Two times a day (BID) | NASAL | 5 refills | Status: DC

## 2023-06-04 MED ORDER — AYR SALINE NASAL NA GEL: 1.0000 | Freq: Two times a day (BID) | NASAL | 5 refills | Status: AC

## 2023-06-04 NOTE — Progress Notes (Signed)
 Dear Dr. Laural Benes, Here is my assessment for our mutual patient, Logan [name]. Thank you for allowing me the opportunity to care for your patient. Please do not hesitate to contact me should you have any other questions. Sincerely, Dr. Jovita Kussmaul  Otolaryngology Clinic Note Referring provider: Dr. Laural Benes HPI:  Logan [name] is a 53 y.o. male kindly referred by Dr. Laural Benes for evaluation of epistaxis. Spanish in person interpreted utilized.  Epistaxis history: left side, ongoing about 18 months but now more frequent; uses cold compress and pressure to stop; lasts 10-15 min, no prior ED visits for this HTN: yes Frequency: 2-3xweek Side: left CKD/Liver dysfunction: yes - ESRD on HD Anticoagulation/AP: no Trauma:denies  History of Sinusitis: denies Nasal obstruction: no Nasal procedures: no Current nasal medication use: afrin PRN for bleeds   H&N Surgery: parathyroidectomy Personal or FHx of bleeding dz or anesthesia difficulty: no  GLP-1: no AP/AC: no  Tobacco: former PMHx: HTN, Hep C, HyperPTH (s/p PT-ectomy), ESRD on HD, HCV s/p curative Rx  Independent Review of Additional Tests or Records:  Dr. Laural Benes (05/04/2023) IM: noted nose bleeds - left side, ongoing about 18 months but now more frequent; uses cold compress and pressure to stop; last 10-15 mins; Dx: Epistaxis; Rx: ENT CBC and CMP 05/04/2023: WBC 6.4, Hgb 14.3, Plt 125; CMP w/ Cr 14.5, BUN 105, Na 146, K 6.3, AST/ALT wnl CT Head 01/27/2022 independent interpretation: cuts thick so not ideal, but no significant sinonasal opacificaiton; mastoids/ME clear, ossicles unremarkable. PMH/Meds/All/SocHx/FamHx/ROS:   Past Medical History:  Diagnosis Date   Anemia    Anemia    pt denies this   Anginal pain (HCC)    Chronic hepatitis    Chronic kidney disease    Complication of anesthesia    Dialysis patient (HCC)    MONDAY, WEDNESDAY, FRIDAY   Dysrhythmia    End stage renal disease (HCC)     dialysis [facility]   GERD (gastroesophageal reflux disease)    diet control no meds   History of hepatitis C    History of hypoparathyroidism    pt denies this   Hypertension    Hypertension    pt denies this   PONV (postoperative nausea and vomiting)    Renal failure      Past Surgical History:  Procedure Laterality Date   AV FISTULA PLACEMENT, BRACHIOCEPHALIC  10/17/10   Left arm by Dr. Arbie Cookey   FISTULA SUPERFICIALIZATION Left 12/13/2020   Procedure: PLICATION OF LEFT BRACHIOCEPHALIC FISTULA ANEURYSM;  Surgeon: Nada Libman, MD;  Location: [facility] OR;  Service: Vascular;  Laterality: Left;   IR AV DIALY SHUNT INTRO NEEDLE/INTRACATH INITIAL W/PTA/IMG LEFT  01/21/2022   IR US GUIDE VASC ACCESS LEFT  01/21/2022   PARATHYROIDECTOMY  01/14/11   PERIPHERAL VASCULAR BALLOON ANGIOPLASTY Left 12/04/2020   Procedure: PERIPHERAL VASCULAR BALLOON ANGIOPLASTY;  Surgeon: Nada Libman, MD;  Location: [facility] INVASIVE [facility];  Service: Cardiovascular;  Laterality: Left;  arm fistula   REVISON OF ARTERIOVENOUS FISTULA Left 05/11/2014   Procedure: RESECTION AND PLICATION OF ANEURSYM LEFT UPPER ARM FISTULA TIMES TWO;  Surgeon: Nada Libman, MD;  Location: [facility] OR;  Service: Vascular;  Laterality: Left;    History reviewed. No pertinent family history.   Social Connections: Socially Integrated (05/04/2023)   Social Connection and Isolation Panel [NHANES]    Frequency of Communication with Friends and Family: Once a week    Frequency of Social Gatherings with Friends and Family: More than three times a  week    Attends Religious Services: More than 4 times per year    Active Member of Clubs or Organizations: Yes    Attends Banker Meetings: More than 4 times per year    Marital Status: Living with partner      Current Outpatient Medications:    acetaminophen (TYLENOL) 325 MG tablet, Take 2 tablets (650 mg total) by mouth every 6 (six) hours as needed., Disp: 30 tablet, Rfl: 0   calcium  elemental as carbonate (TUMS ULTRA 1000) 400 MG chewable tablet, Chew 5,000 mg by mouth 2 (two) times daily with a meal., Disp: , Rfl:    COREG 25 MG tablet, Take by mouth., Disp: , Rfl:    ferric citrate (AURYXIA) 1 GM 210 MG(Fe) tablet, Take 420 mg by mouth 3 (three) times daily with meals., Disp: , Rfl:    saline (AYR) GEL, Place 1 Application into both nostrils in the morning and at bedtime., Disp: 14 g, Rfl: 5   Physical Exam:   BP 102/65   Pulse 68   SpO2 95%   Salient findings:  CN II-XII intact  Bilateral EAC clear and TM intact with well pneumatized middle ear spaces Anterior rhinoscopy: Septum mild dev left; bilateral inferior turbinates without significant hypertrophy; nasal endoscopy offered but patient declined for cost reasons; prominent septal vessel left, no active bleeding No lesions of oral cavity/oropharynx; no vascular lesions noted there No obviously palpable neck masses; neck scar well healed No respiratory distress or stridor  Seprately Identifiable Procedures:  None today see above  Impression & Plans:  Logan [name] is a 53 y.o. male who has ESRD on HD with:  1. Epistaxis    Discussed endo which he declined due to cost reasons; we also discussed mgmt for epistaxis, most likely from left prominent septal vessel; discussed cauterization v/s humidification and patient would like to try humidification for now - Start Ayr GEL q6h Instructions to help prevent future episodes of nose bleeds and management: - Reduce nasal manipulation - no nose picking, no tissues in the nose other than for dabbing.  - Use Afrin nasal spray for ACTIVE bleeding on the bleeding side and hold firm, continuous nasal pressure (on nostril - soft part of the nose, not the bone)  for 15 mins continuously for ACTIVE bleeding.  - May use humidifer in bedroom at night. - Maintain tight BP control, as high blood pressure will certainly increase epistaxis occurrences.   - f/u 8  weeks, sooner if any other concerns  See below regarding exact medications prescribed this encounter including dosages and route: Meds ordered this encounter  Medications   DISCONTD: saline (AYR) GEL    Sig: Place 1 Application into both nostrils in the morning and at bedtime.    Dispense:  14 g    Refill:  5   saline (AYR) GEL    Sig: Place 1 Application into both nostrils in the morning and at bedtime.    Dispense:  14 g    Refill:  5      Thank you for allowing me the opportunity to care for your patient. Please do not hesitate to contact me should you have any other questions.  Sincerely, Jovita Kussmaul, MD Otolaryngologist (ENT), [facility] Phone: [phone] Fax: [phone]  06/07/2023, 10:13 AM   MDM:  Level 3 Complexity/Problems addressed: low Data complexity: mod- independent review of notes, labs; independent interpretation of imaging - Morbidity: low  - Prescription Drug prescribed or  managed: no

## 2023-07-30 ENCOUNTER — Ambulatory Visit (INDEPENDENT_AMBULATORY_CARE_PROVIDER_SITE_OTHER): Payer: Self-pay

## 2023-09-01 ENCOUNTER — Ambulatory Visit: Payer: Self-pay | Admitting: Internal Medicine

## 2024-05-18 ENCOUNTER — Encounter (HOSPITAL_COMMUNITY): Payer: Self-pay | Admitting: Surgery

## 2024-06-02 ENCOUNTER — Ambulatory Visit (HOSPITAL_COMMUNITY): Admit: 2024-06-02 | Payer: Self-pay | Admitting: Surgery

## 2024-06-02 ENCOUNTER — Encounter (HOSPITAL_COMMUNITY): Payer: Self-pay
